# Patient Record
Sex: Female | Born: 1935 | Race: Black or African American | Hispanic: No | Smoking: Never smoker
Health system: Southern US, Community
[De-identification: ages and names within clinical notes are randomized; demographics above are authoritative.]

## PROBLEM LIST (undated history)

## (undated) DIAGNOSIS — G309 Alzheimer's disease, unspecified: Principal | ICD-10-CM

## (undated) DIAGNOSIS — H409 Unspecified glaucoma: Secondary | ICD-10-CM

## (undated) DIAGNOSIS — I10 Essential (primary) hypertension: Secondary | ICD-10-CM

## (undated) DIAGNOSIS — K5900 Constipation, unspecified: Secondary | ICD-10-CM

## (undated) DIAGNOSIS — G4700 Insomnia, unspecified: Secondary | ICD-10-CM

## (undated) DIAGNOSIS — J449 Chronic obstructive pulmonary disease, unspecified: Secondary | ICD-10-CM

## (undated) DIAGNOSIS — I739 Peripheral vascular disease, unspecified: Secondary | ICD-10-CM

## (undated) DIAGNOSIS — R4701 Aphasia: Secondary | ICD-10-CM

## (undated) DIAGNOSIS — F329 Major depressive disorder, single episode, unspecified: Secondary | ICD-10-CM

## (undated) DIAGNOSIS — R32 Unspecified urinary incontinence: Secondary | ICD-10-CM

## (undated) DIAGNOSIS — F0280 Dementia in other diseases classified elsewhere without behavioral disturbance: Secondary | ICD-10-CM

## (undated) DIAGNOSIS — R159 Full incontinence of feces: Secondary | ICD-10-CM

## (undated) HISTORY — DX: Unspecified urinary incontinence: R32

## (undated) HISTORY — DX: Constipation, unspecified: K59.00

## (undated) HISTORY — DX: Dementia in other diseases classified elsewhere, unspecified severity, without behavioral disturbance, psychotic disturbance, mood disturbance, and anxiety: F02.80

## (undated) HISTORY — DX: Insomnia, unspecified: G47.00

## (undated) HISTORY — DX: Chronic obstructive pulmonary disease, unspecified: J44.9

## (undated) HISTORY — DX: Aphasia: R47.01

## (undated) HISTORY — DX: Unspecified glaucoma: H40.9

## (undated) HISTORY — DX: Peripheral vascular disease, unspecified: I73.9

## (undated) HISTORY — DX: Major depressive disorder, single episode, unspecified: F32.9

## (undated) HISTORY — DX: Full incontinence of feces: R15.9

## (undated) HISTORY — DX: Alzheimer's disease, unspecified: G30.9

---

## 2008-05-30 HISTORY — PX: TOTAL HIP ARTHROPLASTY: SHX124

## 2008-09-19 ENCOUNTER — Encounter: Admission: RE | Admit: 2008-09-19 | Discharge: 2008-09-19 | Payer: Self-pay | Admitting: Obstetrics and Gynecology

## 2008-11-26 ENCOUNTER — Emergency Department (HOSPITAL_BASED_OUTPATIENT_CLINIC_OR_DEPARTMENT_OTHER): Admission: EM | Admit: 2008-11-26 | Discharge: 2008-11-26 | Payer: Self-pay | Admitting: Emergency Medicine

## 2008-11-26 ENCOUNTER — Ambulatory Visit: Payer: Self-pay | Admitting: Diagnostic Radiology

## 2009-07-26 ENCOUNTER — Observation Stay (HOSPITAL_COMMUNITY): Admission: EM | Admit: 2009-07-26 | Discharge: 2009-07-27 | Payer: Self-pay | Admitting: Emergency Medicine

## 2009-11-07 ENCOUNTER — Emergency Department (HOSPITAL_BASED_OUTPATIENT_CLINIC_OR_DEPARTMENT_OTHER): Admission: EM | Admit: 2009-11-07 | Discharge: 2009-11-07 | Payer: Self-pay | Admitting: Emergency Medicine

## 2009-11-07 ENCOUNTER — Ambulatory Visit: Payer: Self-pay | Admitting: Radiology

## 2010-02-03 ENCOUNTER — Emergency Department (HOSPITAL_BASED_OUTPATIENT_CLINIC_OR_DEPARTMENT_OTHER): Admission: EM | Admit: 2010-02-03 | Discharge: 2010-02-03 | Payer: Self-pay | Admitting: Emergency Medicine

## 2010-02-03 ENCOUNTER — Ambulatory Visit: Payer: Self-pay | Admitting: Radiology

## 2010-08-12 LAB — DIFFERENTIAL
Eosinophils Absolute: 0 10*3/uL (ref 0.0–0.7)
Lymphs Abs: 0.4 10*3/uL — ABNORMAL LOW (ref 0.7–4.0)
Monocytes Absolute: 0.5 10*3/uL (ref 0.1–1.0)
Neutro Abs: 4.8 10*3/uL (ref 1.7–7.7)
Neutrophils Relative %: 82 % — ABNORMAL HIGH (ref 43–77)

## 2010-08-12 LAB — POCT TOXICOLOGY PANEL

## 2010-08-12 LAB — BASIC METABOLIC PANEL
Chloride: 109 mEq/L (ref 96–112)
Glucose, Bld: 145 mg/dL — ABNORMAL HIGH (ref 70–99)
Sodium: 144 mEq/L (ref 135–145)

## 2010-08-12 LAB — URINALYSIS, ROUTINE W REFLEX MICROSCOPIC
Glucose, UA: NEGATIVE mg/dL
Hgb urine dipstick: NEGATIVE
Specific Gravity, Urine: 1.016 (ref 1.005–1.030)
Urobilinogen, UA: 0.2 mg/dL (ref 0.0–1.0)

## 2010-08-12 LAB — URINE CULTURE: Culture: NO GROWTH

## 2010-08-12 LAB — CBC
HCT: 32.4 % — ABNORMAL LOW (ref 36.0–46.0)
MCHC: 34.1 g/dL (ref 30.0–36.0)
MCV: 90.1 fL (ref 78.0–100.0)
Platelets: 295 10*3/uL (ref 150–400)
RDW: 13.1 % (ref 11.5–15.5)

## 2010-08-16 LAB — COMPREHENSIVE METABOLIC PANEL
ALT: 23 U/L (ref 0–35)
AST: 31 U/L (ref 0–37)
BUN: 20 mg/dL (ref 6–23)
CO2: 34 mEq/L — ABNORMAL HIGH (ref 19–32)
Calcium: 9.7 mg/dL (ref 8.4–10.5)
Creatinine, Ser: 1.1 mg/dL (ref 0.4–1.2)
Potassium: 4.2 mEq/L (ref 3.5–5.1)
Total Protein: 7.3 g/dL (ref 6.0–8.3)

## 2010-08-16 LAB — URINALYSIS, ROUTINE W REFLEX MICROSCOPIC
Bilirubin Urine: NEGATIVE
Hgb urine dipstick: NEGATIVE
Specific Gravity, Urine: 1.018 (ref 1.005–1.030)
Urobilinogen, UA: 0.2 mg/dL (ref 0.0–1.0)
pH: 5.5 (ref 5.0–8.0)

## 2010-08-16 LAB — DIFFERENTIAL
Basophils Absolute: 0.1 10*3/uL (ref 0.0–0.1)
Eosinophils Relative: 1 % (ref 0–5)
Monocytes Absolute: 0.3 10*3/uL (ref 0.1–1.0)
Neutro Abs: 2.3 10*3/uL (ref 1.7–7.7)
Neutrophils Relative %: 60 % (ref 43–77)

## 2010-08-16 LAB — CBC
Hemoglobin: 12.1 g/dL (ref 12.0–15.0)
MCHC: 34.3 g/dL (ref 30.0–36.0)
MCV: 89.1 fL (ref 78.0–100.0)
Platelets: 341 10*3/uL (ref 150–400)
RBC: 3.98 MIL/uL (ref 3.87–5.11)

## 2010-08-19 LAB — CBC
HCT: 36 % (ref 36.0–46.0)
Hemoglobin: 12.3 g/dL (ref 12.0–15.0)
MCHC: 34.1 g/dL (ref 30.0–36.0)
MCV: 89.1 fL (ref 78.0–100.0)
Platelets: 366 10*3/uL (ref 150–400)

## 2010-08-19 LAB — DIFFERENTIAL
Basophils Absolute: 0 10*3/uL (ref 0.0–0.1)
Basophils Relative: 0 % (ref 0–1)
Eosinophils Absolute: 0.1 10*3/uL (ref 0.0–0.7)
Lymphs Abs: 1.8 10*3/uL (ref 0.7–4.0)
Neutrophils Relative %: 68 % (ref 43–77)

## 2010-08-19 LAB — CARDIAC PANEL(CRET KIN+CKTOT+MB+TROPI)
CK, MB: 1.5 ng/mL (ref 0.3–4.0)
Relative Index: INVALID (ref 0.0–2.5)
Total CK: 52 U/L (ref 7–177)
Troponin I: 0.02 ng/mL (ref 0.00–0.06)

## 2010-08-19 LAB — POCT CARDIAC MARKERS: Myoglobin, poc: 70.5 ng/mL (ref 12–200)

## 2010-08-19 LAB — BASIC METABOLIC PANEL
CO2: 31 mEq/L (ref 19–32)
Glucose, Bld: 179 mg/dL — ABNORMAL HIGH (ref 70–99)
Potassium: 3.6 mEq/L (ref 3.5–5.1)
Sodium: 142 mEq/L (ref 135–145)

## 2010-08-19 LAB — TSH: TSH: 1.905 u[IU]/mL (ref 0.350–4.500)

## 2010-09-06 LAB — DIFFERENTIAL
Eosinophils Absolute: 0.1 10*3/uL (ref 0.0–0.7)
Eosinophils Relative: 4 % (ref 0–5)
Lymphs Abs: 1 10*3/uL (ref 0.7–4.0)
Monocytes Absolute: 0.3 10*3/uL (ref 0.1–1.0)

## 2010-09-06 LAB — CBC
HCT: 36.2 % (ref 36.0–46.0)
Hemoglobin: 12.3 g/dL (ref 12.0–15.0)
MCV: 87.2 fL (ref 78.0–100.0)
Platelets: 309 10*3/uL (ref 150–400)
RDW: 13.3 % (ref 11.5–15.5)

## 2010-09-06 LAB — BASIC METABOLIC PANEL
BUN: 18 mg/dL (ref 6–23)
Chloride: 102 mEq/L (ref 96–112)
Glucose, Bld: 95 mg/dL (ref 70–99)
Potassium: 4.3 mEq/L (ref 3.5–5.1)
Sodium: 142 mEq/L (ref 135–145)

## 2010-09-06 LAB — URINE CULTURE: Colony Count: NO GROWTH

## 2010-09-06 LAB — GLUCOSE, CAPILLARY: Glucose-Capillary: 101 mg/dL — ABNORMAL HIGH (ref 70–99)

## 2010-09-06 LAB — URINALYSIS, ROUTINE W REFLEX MICROSCOPIC
Bilirubin Urine: NEGATIVE
Ketones, ur: NEGATIVE mg/dL
Nitrite: NEGATIVE
pH: 6.5 (ref 5.0–8.0)

## 2010-09-06 LAB — URINE MICROSCOPIC-ADD ON

## 2010-09-06 LAB — POCT CARDIAC MARKERS: Troponin i, poc: <0.05 ng/mL (ref 0.00–0.09)

## 2010-11-04 ENCOUNTER — Emergency Department (HOSPITAL_COMMUNITY): Payer: Medicare Other

## 2010-11-04 ENCOUNTER — Emergency Department (HOSPITAL_COMMUNITY)
Admission: EM | Admit: 2010-11-04 | Discharge: 2010-11-04 | Disposition: A | Discharge status: Skilled Nursing Facility | Payer: Medicare Other | Attending: Emergency Medicine | Admitting: Emergency Medicine

## 2010-11-04 DIAGNOSIS — M25559 Pain in unspecified hip: Secondary | ICD-10-CM | POA: Insufficient documentation

## 2010-11-04 DIAGNOSIS — Z9181 History of falling: Secondary | ICD-10-CM | POA: Insufficient documentation

## 2010-11-04 DIAGNOSIS — M1990 Unspecified osteoarthritis, unspecified site: Secondary | ICD-10-CM | POA: Insufficient documentation

## 2010-11-04 DIAGNOSIS — F0280 Dementia in other diseases classified elsewhere without behavioral disturbance: Secondary | ICD-10-CM | POA: Insufficient documentation

## 2010-11-04 DIAGNOSIS — Y9210 Unspecified residential institution as the place of occurrence of the external cause: Secondary | ICD-10-CM | POA: Insufficient documentation

## 2010-11-04 DIAGNOSIS — Z0000 Encounter for general adult medical examination without abnormal findings: Secondary | ICD-10-CM | POA: Insufficient documentation

## 2010-11-04 DIAGNOSIS — I10 Essential (primary) hypertension: Secondary | ICD-10-CM | POA: Insufficient documentation

## 2010-11-04 DIAGNOSIS — G309 Alzheimer's disease, unspecified: Secondary | ICD-10-CM | POA: Insufficient documentation

## 2010-11-04 DIAGNOSIS — W19XXXA Unspecified fall, initial encounter: Secondary | ICD-10-CM | POA: Insufficient documentation

## 2011-01-03 ENCOUNTER — Emergency Department (HOSPITAL_COMMUNITY): Payer: Medicare Other

## 2011-01-03 ENCOUNTER — Emergency Department (HOSPITAL_COMMUNITY)
Admission: EM | Admit: 2011-01-03 | Discharge: 2011-01-03 | Disposition: A | Discharge status: Home / Self Care | Payer: Medicare Other | Attending: Emergency Medicine | Admitting: Emergency Medicine

## 2011-01-03 DIAGNOSIS — F068 Other specified mental disorders due to known physiological condition: Secondary | ICD-10-CM | POA: Insufficient documentation

## 2011-01-03 DIAGNOSIS — I739 Peripheral vascular disease, unspecified: Secondary | ICD-10-CM | POA: Insufficient documentation

## 2011-01-03 DIAGNOSIS — R4182 Altered mental status, unspecified: Secondary | ICD-10-CM | POA: Insufficient documentation

## 2011-01-03 DIAGNOSIS — F0280 Dementia in other diseases classified elsewhere without behavioral disturbance: Secondary | ICD-10-CM | POA: Insufficient documentation

## 2011-01-03 DIAGNOSIS — G309 Alzheimer's disease, unspecified: Secondary | ICD-10-CM | POA: Insufficient documentation

## 2011-01-03 DIAGNOSIS — Z79899 Other long term (current) drug therapy: Secondary | ICD-10-CM | POA: Insufficient documentation

## 2011-01-03 DIAGNOSIS — I10 Essential (primary) hypertension: Secondary | ICD-10-CM | POA: Insufficient documentation

## 2011-01-03 LAB — BASIC METABOLIC PANEL
BUN: 26 mg/dL — ABNORMAL HIGH (ref 6–23)
CO2: 31 mEq/L (ref 19–32)
Chloride: 102 mEq/L (ref 96–112)
Creatinine, Ser: 1.01 mg/dL (ref 0.50–1.10)
GFR calc Af Amer: >60 mL/min (ref >60)
Potassium: 4.9 mEq/L (ref 3.5–5.1)

## 2011-01-03 LAB — CBC
HCT: 33.5 % — ABNORMAL LOW (ref 36.0–46.0)
Hemoglobin: 10.9 g/dL — ABNORMAL LOW (ref 12.0–15.0)
MCH: 26.9 pg (ref 26.0–34.0)
MCV: 82.7 fL (ref 78.0–100.0)
Platelets: 370 10*3/uL (ref 150–400)
RBC: 4.05 MIL/uL (ref 3.87–5.11)
WBC: 5.5 10*3/uL (ref 4.0–10.5)

## 2011-01-03 LAB — URINALYSIS, ROUTINE W REFLEX MICROSCOPIC
Bilirubin Urine: NEGATIVE
Glucose, UA: NEGATIVE mg/dL
Ketones, ur: NEGATIVE mg/dL
Nitrite: NEGATIVE
Specific Gravity, Urine: 1.018 (ref 1.005–1.030)
pH: 5 (ref 5.0–8.0)

## 2011-01-03 LAB — DIFFERENTIAL
Lymphocytes Relative: 24 % (ref 12–46)
Lymphs Abs: 1.3 10*3/uL (ref 0.7–4.0)
Monocytes Relative: 8 % (ref 3–12)
Neutro Abs: 3.7 10*3/uL (ref 1.7–7.7)
Neutrophils Relative %: 66 % (ref 43–77)

## 2011-01-04 LAB — URINE CULTURE
Colony Count: NO GROWTH
Culture  Setup Time: 201208061254
Culture: NO GROWTH

## 2011-01-26 ENCOUNTER — Emergency Department (HOSPITAL_COMMUNITY)
Admission: EM | Admit: 2011-01-26 | Discharge: 2011-01-26 | Disposition: A | Discharge status: Home / Self Care | Payer: Medicare Other | Attending: Emergency Medicine | Admitting: Emergency Medicine

## 2011-01-26 DIAGNOSIS — F3289 Other specified depressive episodes: Secondary | ICD-10-CM | POA: Insufficient documentation

## 2011-01-26 DIAGNOSIS — F329 Major depressive disorder, single episode, unspecified: Secondary | ICD-10-CM | POA: Insufficient documentation

## 2011-01-26 DIAGNOSIS — R4182 Altered mental status, unspecified: Secondary | ICD-10-CM | POA: Insufficient documentation

## 2011-01-26 DIAGNOSIS — F29 Unspecified psychosis not due to a substance or known physiological condition: Secondary | ICD-10-CM | POA: Insufficient documentation

## 2011-01-26 DIAGNOSIS — G309 Alzheimer's disease, unspecified: Secondary | ICD-10-CM | POA: Insufficient documentation

## 2011-01-26 DIAGNOSIS — I10 Essential (primary) hypertension: Secondary | ICD-10-CM | POA: Insufficient documentation

## 2011-01-26 DIAGNOSIS — I739 Peripheral vascular disease, unspecified: Secondary | ICD-10-CM | POA: Insufficient documentation

## 2011-01-26 DIAGNOSIS — F0280 Dementia in other diseases classified elsewhere without behavioral disturbance: Secondary | ICD-10-CM | POA: Insufficient documentation

## 2011-01-26 DIAGNOSIS — Z79899 Other long term (current) drug therapy: Secondary | ICD-10-CM | POA: Insufficient documentation

## 2011-01-26 LAB — DIFFERENTIAL
Basophils Absolute: 0 10*3/uL (ref 0.0–0.1)
Basophils Relative: 0 % (ref 0–1)
Eosinophils Absolute: 0.1 10*3/uL (ref 0.0–0.7)
Monocytes Absolute: 0.3 10*3/uL (ref 0.1–1.0)
Monocytes Relative: 6 % (ref 3–12)
Neutrophils Relative %: 66 % (ref 43–77)

## 2011-01-26 LAB — COMPREHENSIVE METABOLIC PANEL
ALT: 14 U/L (ref 0–35)
AST: 20 U/L (ref 0–37)
Albumin: 3.4 g/dL — ABNORMAL LOW (ref 3.5–5.2)
Calcium: 9.6 mg/dL (ref 8.4–10.5)
GFR calc Af Amer: 59 mL/min — ABNORMAL LOW (ref >60)
Potassium: 4 mEq/L (ref 3.5–5.1)
Sodium: 140 mEq/L (ref 135–145)
Total Protein: 7 g/dL (ref 6.0–8.3)

## 2011-01-26 LAB — URINALYSIS, ROUTINE W REFLEX MICROSCOPIC
Bilirubin Urine: NEGATIVE
Ketones, ur: NEGATIVE mg/dL
Nitrite: NEGATIVE
Protein, ur: NEGATIVE mg/dL
Urobilinogen, UA: 0.2 mg/dL (ref 0.0–1.0)

## 2011-01-26 LAB — CBC
MCH: 26.9 pg (ref 26.0–34.0)
MCHC: 32.3 g/dL (ref 30.0–36.0)
Platelets: 370 10*3/uL (ref 150–400)
RDW: 17.3 % — ABNORMAL HIGH (ref 11.5–15.5)

## 2011-07-04 IMAGING — CR DG CHEST 2V
2 series · 2 of 2 positions shown · non-contrast
Comparison: 07/26/2009.

CLINICAL DATA: Altered mental status.  COPD.

CHEST - 2 VIEW

[w chest pa]
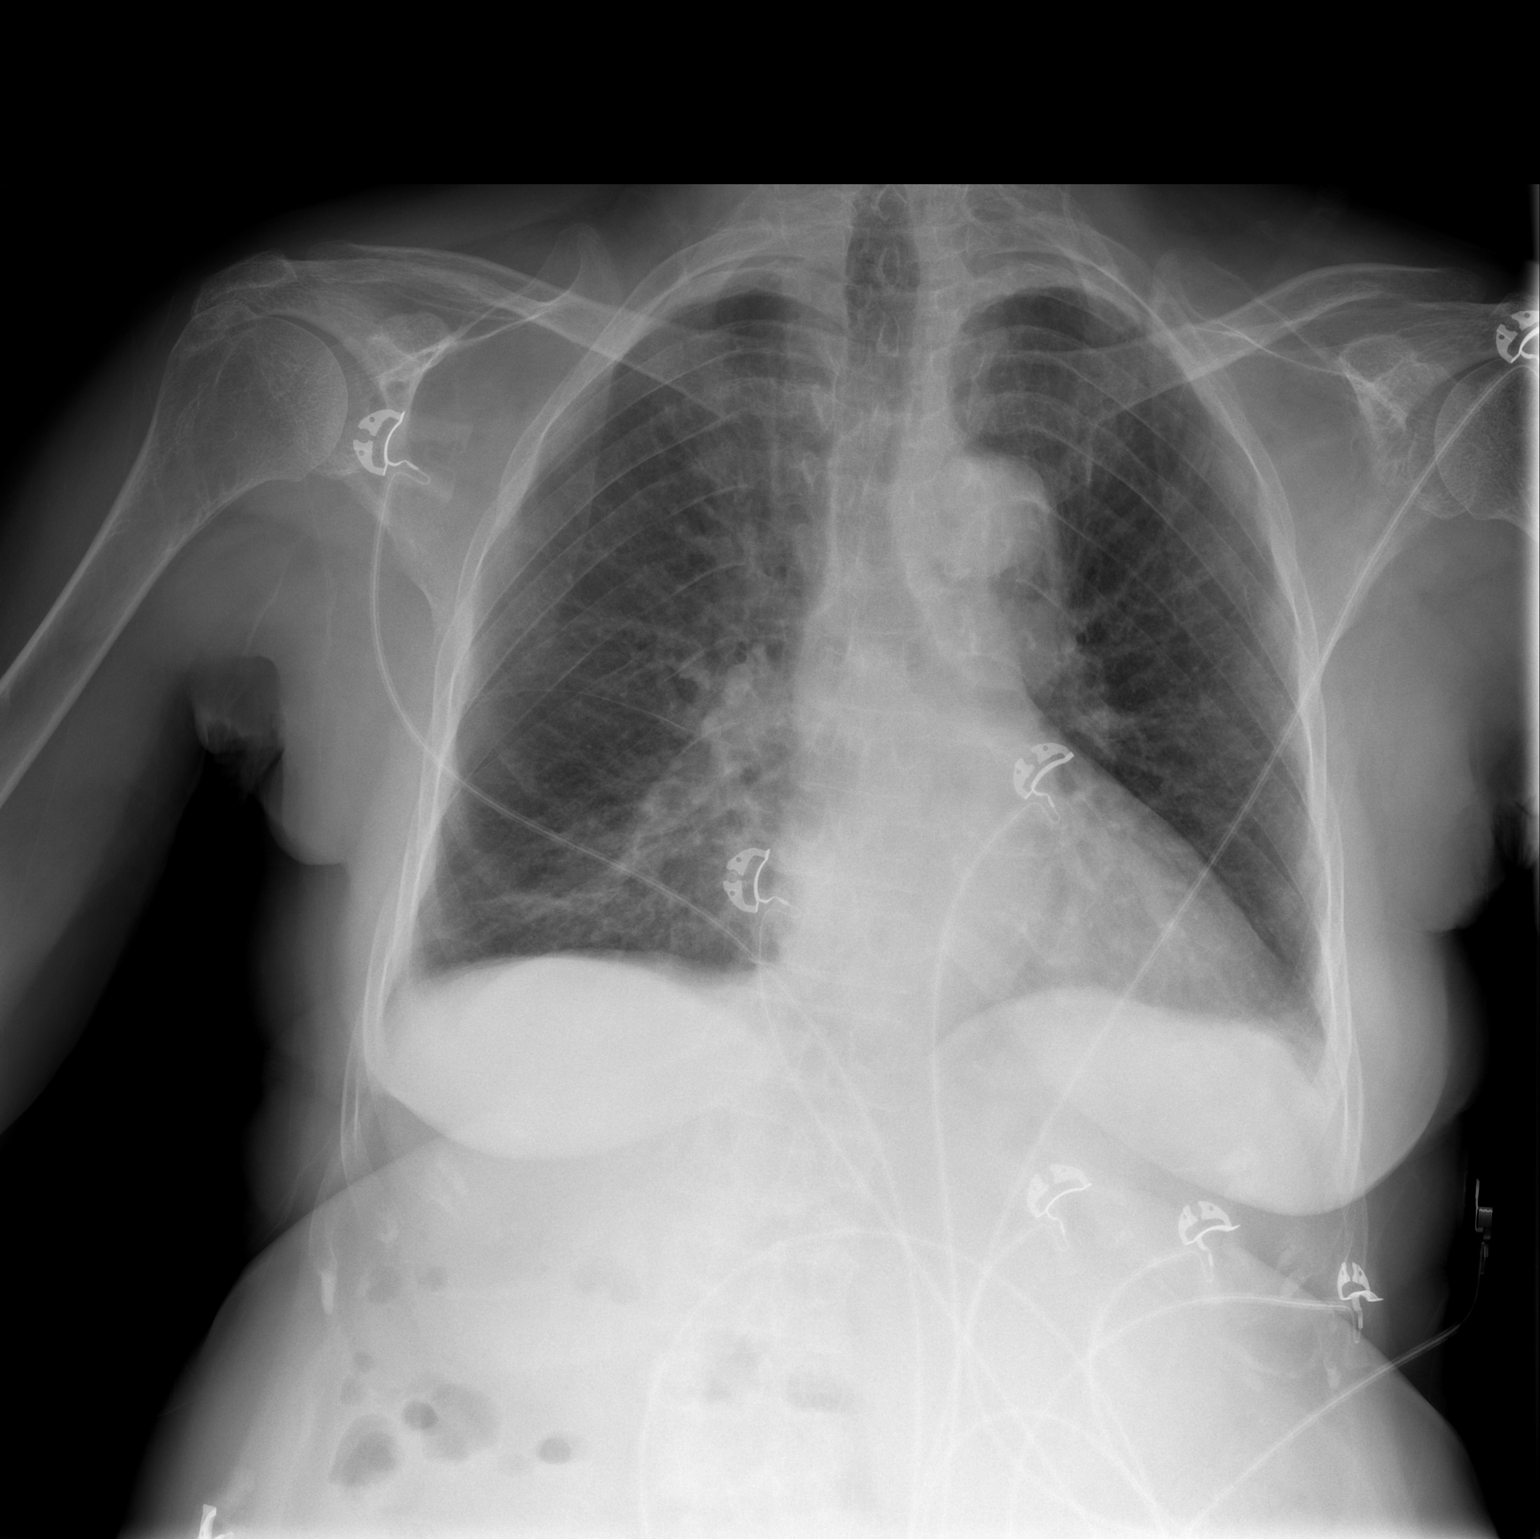

[w chest lat]
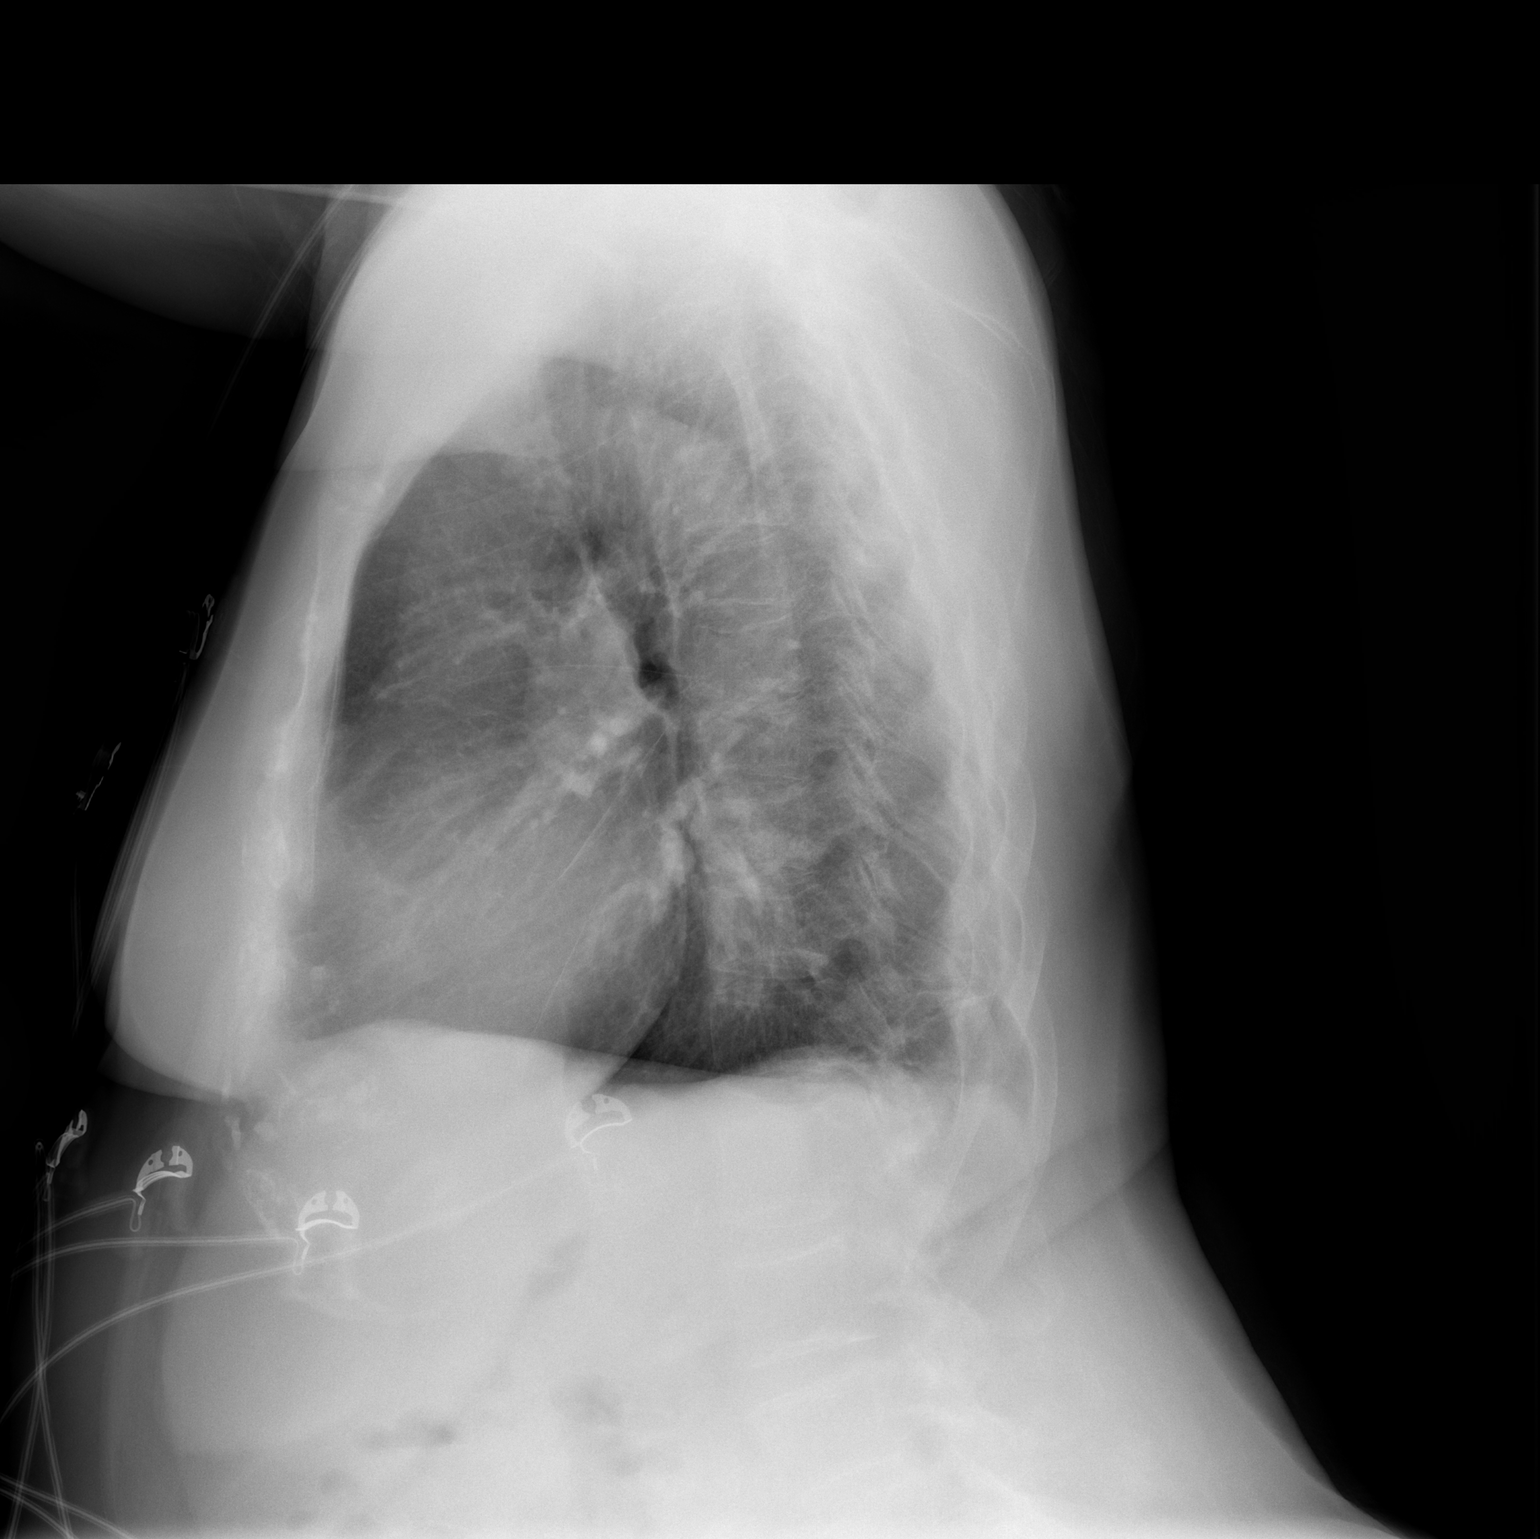

[2 of 2 positions shown; findings below may reference images not displayed]

FINDINGS: The cardiopericardial silhouette is mildly enlarged.
Linear density at the right lung base is compatible with
subsegmental atelectasis.  There is slight blunting of the
costophrenic angles bilaterally, similar to the prior exam.  This
may represent chronic scarring.  Changes of COPD are again noted.
No other focal airspace disease is evident.  The visualized soft
tissues and bony thorax are unremarkable.
IMPRESSION: 1.  Minimal subsegmental atelectasis at the right lung base.
2.  Changes compatible with COPD.
3.  Chronic scarring versus small bilateral pleural effusions.

## 2011-09-30 IMAGING — CR DG ABDOMEN 1V
1 series · 1 of 1 positions shown · non-contrast
Comparison: None.

CLINICAL DATA: Altered mental status

ABDOMEN - 1 VIEW

[t abdomen supine]
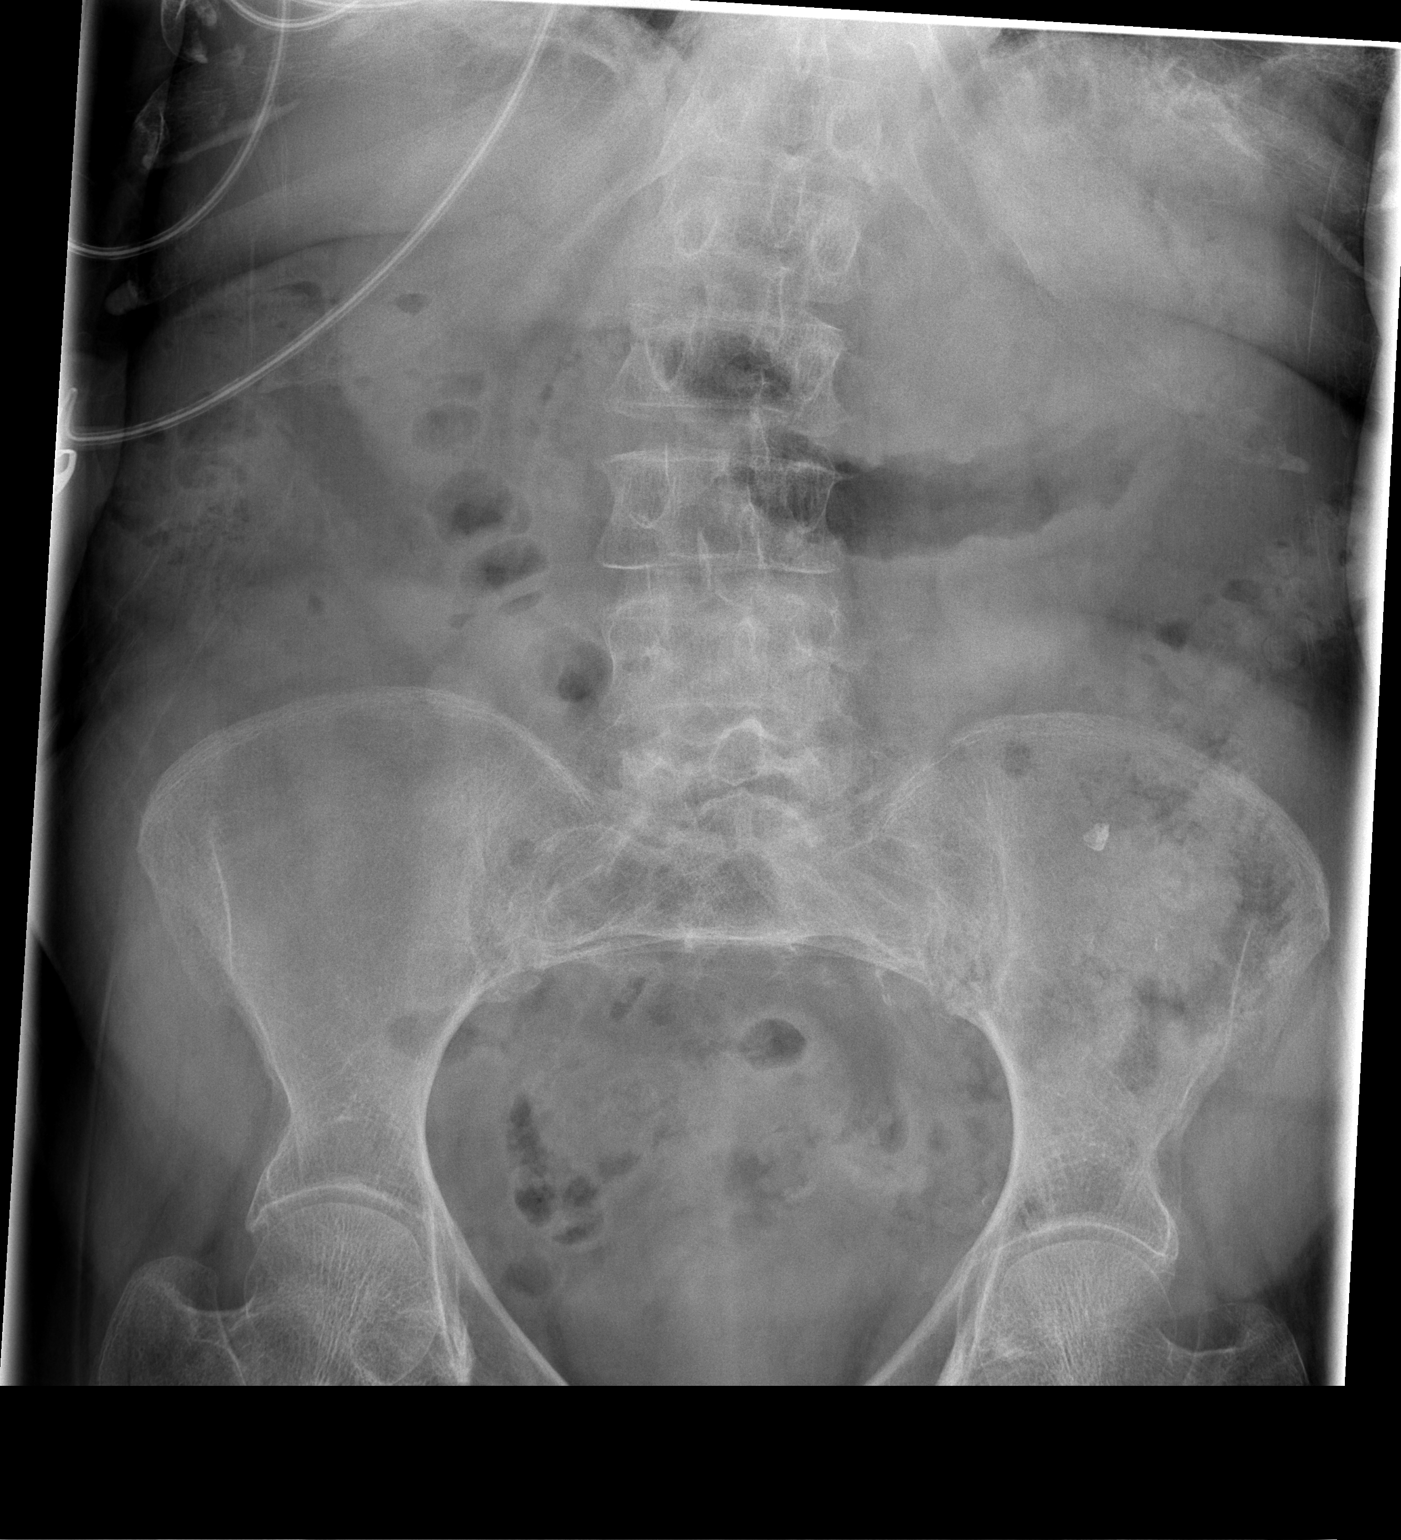

[1 of 1 positions shown; findings below may reference images not displayed]

FINDINGS: There is no evidence of small bowel obstruction.
Specifically, there are no dilated loops small bowel .  Gas and
stool seen throughout the colon.  The calcifications seen in the
left mid abdomen likely related to diverticulum.  The osseous
structures are within normal limits.
IMPRESSION: No acute findings.

## 2013-01-25 ENCOUNTER — Emergency Department (HOSPITAL_COMMUNITY): Payer: Medicare Other

## 2013-01-25 ENCOUNTER — Inpatient Hospital Stay (HOSPITAL_COMMUNITY)
Admission: EM | Admit: 2013-01-25 | Discharge: 2013-01-27 | DRG: 682 | Disposition: A | Discharge status: Nursing Home (Includes ICF) | Payer: Medicare Other | Attending: Internal Medicine | Admitting: Internal Medicine

## 2013-01-25 DIAGNOSIS — N179 Acute kidney failure, unspecified: Principal | ICD-10-CM | POA: Diagnosis present

## 2013-01-25 DIAGNOSIS — E860 Dehydration: Secondary | ICD-10-CM | POA: Diagnosis present

## 2013-01-25 DIAGNOSIS — T50995A Adverse effect of other drugs, medicaments and biological substances, initial encounter: Secondary | ICD-10-CM | POA: Diagnosis present

## 2013-01-25 DIAGNOSIS — T4275XA Adverse effect of unspecified antiepileptic and sedative-hypnotic drugs, initial encounter: Secondary | ICD-10-CM | POA: Diagnosis present

## 2013-01-25 DIAGNOSIS — N390 Urinary tract infection, site not specified: Secondary | ICD-10-CM | POA: Diagnosis present

## 2013-01-25 DIAGNOSIS — E870 Hyperosmolality and hypernatremia: Secondary | ICD-10-CM | POA: Diagnosis present

## 2013-01-25 DIAGNOSIS — G9340 Encephalopathy, unspecified: Secondary | ICD-10-CM | POA: Diagnosis present

## 2013-01-25 DIAGNOSIS — R892 Abnormal level of other drugs, medicaments and biological substances in specimens from other organs, systems and tissues: Secondary | ICD-10-CM | POA: Diagnosis present

## 2013-01-25 DIAGNOSIS — Z79899 Other long term (current) drug therapy: Secondary | ICD-10-CM

## 2013-01-25 LAB — COMPREHENSIVE METABOLIC PANEL
AST: 17 U/L (ref 0–37)
Albumin: 3.1 g/dL — ABNORMAL LOW (ref 3.5–5.2)
Calcium: 9.6 mg/dL (ref 8.4–10.5)
Chloride: 107 mEq/L (ref 96–112)
Creatinine, Ser: 1.29 mg/dL — ABNORMAL HIGH (ref 0.50–1.10)

## 2013-01-25 LAB — URINE MICROSCOPIC-ADD ON

## 2013-01-25 LAB — CBC WITH DIFFERENTIAL/PLATELET
Basophils Absolute: 0 10*3/uL (ref 0.0–0.1)
Basophils Relative: 0 % (ref 0–1)
Eosinophils Relative: 3 % (ref 0–5)
HCT: 36.9 % (ref 36.0–46.0)
MCHC: 31.2 g/dL (ref 30.0–36.0)
Monocytes Absolute: 0.4 10*3/uL (ref 0.1–1.0)
Neutro Abs: 2.8 10*3/uL (ref 1.7–7.7)
RDW: 14.2 % (ref 11.5–15.5)

## 2013-01-25 LAB — URINALYSIS, ROUTINE W REFLEX MICROSCOPIC
Bilirubin Urine: NEGATIVE
Glucose, UA: NEGATIVE mg/dL
Hgb urine dipstick: NEGATIVE
Nitrite: NEGATIVE
Specific Gravity, Urine: 1.018 (ref 1.005–1.030)
pH: 5 (ref 5.0–8.0)

## 2013-01-25 MED ORDER — TAB-A-VITE/IRON PO TABS
1.0000 | ORAL_TABLET | Freq: Every day | ORAL | Status: DC
  Administered 2013-01-26 – 2013-01-27 (×2): 1 via ORAL
  Filled 2013-01-25 (×2): qty 1

## 2013-01-25 MED ORDER — METOPROLOL SUCCINATE 12.5 MG HALF TABLET
12.5000 mg | ORAL_TABLET | Freq: Two times a day (BID) | ORAL | Status: DC
  Administered 2013-01-26 – 2013-01-27 (×3): 12.5 mg via ORAL
  Filled 2013-01-25 (×6): qty 1

## 2013-01-25 MED ORDER — DIVALPROEX SODIUM ER 250 MG PO TB24
250.0000 mg | ORAL_TABLET | Freq: Every day | ORAL | Status: DC
  Administered 2013-01-26: 250 mg via ORAL
  Filled 2013-01-25 (×2): qty 1

## 2013-01-25 MED ORDER — DIVALPROEX SODIUM 250 MG PO DR TAB: 250.0000 mg | DELAYED_RELEASE_TABLET | Freq: Once | ORAL | Status: DC

## 2013-01-25 MED ORDER — SODIUM CHLORIDE 0.9 % IV SOLN
Freq: Once | INTRAVENOUS | Status: AC
  Administered 2013-01-25: 18:00:00 via INTRAVENOUS

## 2013-01-25 MED ORDER — GUAIFENESIN ER 600 MG PO TB12
1200.0000 mg | ORAL_TABLET | Freq: Two times a day (BID) | ORAL | Status: DC | PRN
  Filled 2013-01-25: qty 2

## 2013-01-25 MED ORDER — RISPERIDONE 2 MG PO TABS
2.0000 mg | ORAL_TABLET | Freq: Every day | ORAL | Status: DC
  Administered 2013-01-26: 2 mg via ORAL
  Filled 2013-01-25 (×3): qty 1

## 2013-01-25 MED ORDER — ASPIRIN 81 MG PO CHEW
81.0000 mg | CHEWABLE_TABLET | Freq: Every day | ORAL | Status: DC
  Administered 2013-01-26 – 2013-01-27 (×2): 81 mg via ORAL
  Filled 2013-01-25 (×2): qty 1

## 2013-01-25 MED ORDER — FERROUS SULFATE 325 (65 FE) MG PO TABS
325.0000 mg | ORAL_TABLET | Freq: Two times a day (BID) | ORAL | Status: DC
  Administered 2013-01-26 – 2013-01-27 (×3): 325 mg via ORAL
  Filled 2013-01-25 (×4): qty 1

## 2013-01-25 MED ORDER — ACETAMINOPHEN 500 MG PO TABS: 500.0000 mg | ORAL_TABLET | Freq: Four times a day (QID) | ORAL | Status: DC | PRN

## 2013-01-25 MED ORDER — SODIUM CHLORIDE 0.9 % IV SOLN
INTRAVENOUS | Status: DC
  Administered 2013-01-25: 1000 mL via INTRAVENOUS

## 2013-01-25 MED ORDER — SIMVASTATIN 5 MG PO TABS
5.0000 mg | ORAL_TABLET | Freq: Every day | ORAL | Status: DC
  Administered 2013-01-26: 5 mg via ORAL
  Filled 2013-01-25 (×3): qty 1

## 2013-01-25 MED ORDER — ONDANSETRON HCL 4 MG/2ML IJ SOLN: 4.0000 mg | Freq: Four times a day (QID) | INTRAMUSCULAR | Status: DC | PRN

## 2013-01-25 MED ORDER — CEFTRIAXONE SODIUM 1 G IJ SOLR
1.0000 g | Freq: Once | INTRAMUSCULAR | Status: DC
  Administered 2013-01-25: 1 g via INTRAVENOUS
  Filled 2013-01-25: qty 10

## 2013-01-25 MED ORDER — LATANOPROST 0.005 % OP SOLN
1.0000 [drp] | Freq: Every day | OPHTHALMIC | Status: DC
  Administered 2013-01-25 – 2013-01-26 (×2): 1 [drp] via OPHTHALMIC
  Filled 2013-01-25: qty 2.5

## 2013-01-25 MED ORDER — DEXTROSE 5 % IV SOLN
1.0000 g | INTRAVENOUS | Status: DC
  Administered 2013-01-26: 1 g via INTRAVENOUS
  Filled 2013-01-25 (×2): qty 10

## 2013-01-25 MED ORDER — ENOXAPARIN SODIUM 30 MG/0.3ML ~~LOC~~ SOLN
30.0000 mg | SUBCUTANEOUS | Status: DC
  Administered 2013-01-25 – 2013-01-26 (×2): 30 mg via SUBCUTANEOUS
  Filled 2013-01-25 (×3): qty 0.3

## 2013-01-25 MED ORDER — ONDANSETRON HCL 4 MG PO TABS: 4.0000 mg | ORAL_TABLET | Freq: Four times a day (QID) | ORAL | Status: DC | PRN

## 2013-01-25 MED ORDER — SENNOSIDES-DOCUSATE SODIUM 8.6-50 MG PO TABS
1.0000 | ORAL_TABLET | Freq: Every evening | ORAL | Status: DC | PRN
  Filled 2013-01-25: qty 1

## 2013-01-25 NOTE — ED Provider Notes (Signed)
CSN: [id number]     Arrival date & time 01/25/13  1322 History   First MD Initiated Contact with Patient 01/25/13 1358     Chief Complaint  Patient presents with  . Altered Mental Status    HPI   Patient is a resident of extended care facility.  He is on psychotropic medications apparently for episodes of agitation. For these Depakote. Her Depakote was doubled on the 21st. Granddaughter who is here with her states that her energy level mental status has declined since that time. She requested she be transferred here.   Her granddaughter provide review of systems. She sees her grandmother about every other day. She to her knowledge she has not been ill. She's not had dysuria. She was eating well. No fevers no chills no vomiting No past medical history on file. No past surgical history on file. No family history on file. History  Substance Use Topics  . Smoking status: Not on file  . Smokeless tobacco: Not on file  . Alcohol Use: Not on file   OB History   No data available     Review of Systems  Constitutional: Positive for fatigue. Negative for fever, chills, diaphoresis and appetite change.  HENT: Negative for sore throat, mouth sores and trouble swallowing.   Eyes: Negative for visual disturbance.  Respiratory: Positive for cough. Negative for chest tightness, shortness of breath and wheezing.   Cardiovascular: Negative for chest pain.  Gastrointestinal: Negative for nausea, vomiting, abdominal pain, diarrhea and abdominal distention.  Endocrine: Negative for polydipsia, polyphagia and polyuria.  Genitourinary: Negative for dysuria, frequency and hematuria.  Musculoskeletal: Negative for gait problem.  Skin: Negative for color change, pallor and rash.  Neurological: Positive for weakness. Negative for dizziness, syncope, light-headedness and headaches.  Hematological: Does not bruise/bleed easily.  Psychiatric/Behavioral: Positive for behavioral problems and confusion.     Allergies  Review of patient's allergies indicates no known allergies.  Home Medications   Current Outpatient Rx  Name  Route  Sig  Dispense  Refill  . acetaminophen (TYLENOL) 500 MG tablet   Oral   Take 500 mg by mouth every 6 (six) hours as needed for pain.         Marland Kitchen aspirin 81 MG tablet   Oral   Take 81 mg by mouth daily.         . bisacodyl (DULCOLAX) 10 MG suppository   Rectal   Place 10 mg rectally as needed for constipation.         . divalproex (DEPAKOTE ER) 250 MG 24 hr tablet   Oral   Take 250 mg by mouth daily.         . ferrous sulfate 325 (65 FE) MG tablet   Oral   Take 325 mg by mouth 2 (two) times daily.         . furosemide (LASIX) 20 MG tablet   Oral   Take 10 mg by mouth daily.         Marland Kitchen guaiFENesin (MUCINEX) 600 MG 12 hr tablet   Oral   Take 1,200 mg by mouth 2 (two) times daily as needed for congestion (cough).         . latanoprost (XALATAN) 0.005 % ophthalmic solution   Both Eyes   Place 1 drop into both eyes at bedtime.         Marland Kitchen lisinopril (PRINIVIL,ZESTRIL) 5 MG tablet   Oral   Take 5 mg by mouth daily.         Marland Kitchen  magnesium hydroxide (MILK OF MAGNESIA) 400 MG/5ML suspension   Oral   Take 1 mL by mouth daily as needed for constipation.         . Melatonin 3 MG TABS   Oral   Take 3 mg by mouth at bedtime.         . metoprolol succinate (TOPROL-XL) 25 MG 24 hr tablet   Oral   Take 12.5 mg by mouth 2 (two) times daily.         . Multiple Vitamins-Iron (MULTIVITAMINS WITH IRON) TABS tablet   Oral   Take 1 tablet by mouth daily.         . pravastatin (PRAVACHOL) 10 MG tablet   Oral   Take 10 mg by mouth daily.         . risperiDONE (RISPERDAL) 2 MG tablet   Oral   Take 2 mg by mouth at bedtime.         . Skin Protectants, Misc. (EUCERIN) cream   Topical   Apply 1 application topically daily.         . sodium chloride ([employer] SODIUM CHLORIDE) 5 % ophthalmic ointment   Right Eye   Place 1 drop  into the right eye at bedtime.          BP 111/46  Pulse 88  Resp 33  SpO2 97% Physical Exam  Constitutional: She is oriented to person, place, and time. She appears well-developed and well-nourished. No distress.  Thin elderly female.  She answers simple questions usually with "UH_HUH"  Or "UH-UH".  Her interactions and responses to seem appropriate  HENT:  Head: Normocephalic.  Mouth/Throat:    Eyes: Conjunctivae are normal. Pupils are equal, round, and reactive to light. No scleral icterus.  Neck: Normal range of motion. Neck supple. No thyromegaly present.  Cardiovascular: Normal rate and regular rhythm.  Exam reveals no gallop and no friction rub.   No murmur heard. Is not tachycardic  Pulmonary/Chest: Effort normal and breath sounds normal. No respiratory distress. She has no wheezes. She has no rales.  Lungs are clear and no increased work or breathing  Abdominal: Soft. Bowel sounds are normal. She exhibits no distension. There is no tenderness. There is no rebound.  Musculoskeletal: Normal range of motion.  Neurological: She is alert and oriented to person, place, and time.  Eyes closed. Does respond as above. She will move all 4 extremity  Skin: Skin is warm and dry. No rash noted.  Psychiatric: She has a normal mood and affect. Her behavior is normal.    ED Course  Procedures (including critical care time) Labs Review Labs Reviewed  URINALYSIS, ROUTINE W REFLEX MICROSCOPIC - Abnormal; Notable for the following:    APPearance CLOUDY (*)    Leukocytes, UA LARGE (*)    All other components within normal limits  CBC WITH DIFFERENTIAL - Abnormal; Notable for the following:    Hemoglobin 11.5 (*)    All other components within normal limits  COMPREHENSIVE METABOLIC PANEL - Abnormal; Notable for the following:    BUN 35 (*)    Creatinine, Ser 1.29 (*)    Albumin 3.1 (*)    Total Bilirubin 0.2 (*)    GFR calc non Af Amer 35 (*)    GFR calc Af Amer 41 (*)    All  other components within normal limits  URINE MICROSCOPIC-ADD ON - Abnormal; Notable for the following:    Bacteria, UA MANY (*)    All other  components within normal limits  URINE CULTURE  VALPROIC ACID LEVEL   Imaging Review Dg Chest 1 View  01/25/2013   *RADIOLOGY REPORT*  Clinical Data: Unresponsive, altered mental status  CHEST - 1 VIEW  Comparison: 01/03/2011  Findings: Cardiomegaly is noted.  Study is limited by poor inspiration.  Central mild vascular congestion without convincing pulmonary edema.  No segmental infiltrate. Mild basilar atelectasis.  IMPRESSION: Study is limited by poor inspiration.  Central mild vascular congestion without convincing pulmonary edema.  No segmental infiltrate. Mild basilar atelectasis.   Original Report Authenticated By: Natasha Mead, M.D.    MDM   1. UTI (lower urinary tract infection)    This elderly female who had a change in mental status after a drug dosage was changed we'll check a Depakote level. We'll check a CMP. We'll check a urine. She's had a cough but is not hypoxemic and has a normal pulmonary exam. However we will obtain a chest x-ray.  Chest x-ray shows no acute disease. Her urine appears obviously infected. Clinically she appears dry she has not been taking in much. I still do not have her Depakote level. Will use Allevyn is awake plays bingo is active. And chem treatments with IV antibiotics and fluids as indicated. They simply need holding of her Depakote dose and hydration at this is elevated.    Claudean Kinds, MD 01/25/13 [phone]

## 2013-01-25 NOTE — H&P (Signed)
[facility]          History and Physical    PCP:   No primary provider on file.   Chief Complaint:  Increased lethargy  HPI: Patient is a 79 year old woman who resides at skilled nursing facility, who is brought in today at the urging of her granddaughter secondary to increased confusion. Granddaughter states that about 10 days ago they doubled the dose of her Depakote and that about 3 days later she started noticing her decline. Patient is usually alert enough to where she paddles around in her wheelchair and is able to play bingo, however she has not been able to do this for the past several days. Workup in the emergency department is significant for a urinary tract infection and acute renal failure. We have been asked to admit her for further evaluation and management.  Allergies:  No Known Allergies   No past medical history on file.  No past surgical history on file.  Prior to Admission medications   Medication Sig Start Date End Date Taking? Authorizing Provider  acetaminophen (TYLENOL) 500 MG tablet Take 500 mg by mouth every 6 (six) hours as needed for pain.   Yes Historical Provider, MD  aspirin 81 MG tablet Take 81 mg by mouth daily.   Yes Historical Provider, MD  bisacodyl (DULCOLAX) 10 MG suppository Place 10 mg rectally as needed for constipation.   Yes Historical Provider, MD  divalproex (DEPAKOTE ER) 250 MG 24 hr tablet Take 250 mg by mouth daily.   Yes Historical Provider, MD  ferrous sulfate 325 (65 FE) MG tablet Take 325 mg by mouth 2 (two) times daily.   Yes Historical Provider, MD  furosemide (LASIX) 20 MG tablet Take 10 mg by mouth daily.   Yes Historical Provider, MD  guaiFENesin (MUCINEX) 600 MG 12 hr tablet Take 1,200 mg by mouth 2 (two) times daily as needed for congestion (cough).   Yes Historical Provider, MD  latanoprost (XALATAN) 0.005 % ophthalmic solution Place 1 drop into both eyes at bedtime.   Yes Historical Provider, MD  lisinopril  (PRINIVIL,ZESTRIL) 5 MG tablet Take 5 mg by mouth daily.   Yes Historical Provider, MD  magnesium hydroxide (MILK OF MAGNESIA) 400 MG/5ML suspension Take 1 mL by mouth daily as needed for constipation.   Yes Historical Provider, MD  Melatonin 3 MG TABS Take 3 mg by mouth at bedtime.   Yes Historical Provider, MD  metoprolol succinate (TOPROL-XL) 25 MG 24 hr tablet Take 12.5 mg by mouth 2 (two) times daily.   Yes Historical Provider, MD  Multiple Vitamins-Iron (MULTIVITAMINS WITH IRON) TABS tablet Take 1 tablet by mouth daily.   Yes Historical Provider, MD  pravastatin (PRAVACHOL) 10 MG tablet Take 10 mg by mouth daily.   Yes Historical Provider, MD  risperiDONE (RISPERDAL) 2 MG tablet Take 2 mg by mouth at bedtime.   Yes Historical Provider, MD  Skin Protectants, Misc. (EUCERIN) cream Apply 1 application topically daily.   Yes Historical Provider, MD  sodium chloride ([employer]S SODIUM CHLORIDE) 5 % ophthalmic ointment Place 1 drop into the right eye at bedtime.   Yes Historical Provider, MD    Social History:  has no tobacco, alcohol, and drug history on file.  No family history on file.  Review of Systems:  Constitutional: Denies fever, chills, diaphoresis, appetite change and fatigue.  HEENT: Denies photophobia, eye pain, redness, hearing loss, ear pain, congestion, sore throat, rhinorrhea, sneezing, mouth sores, trouble swallowing, neck pain, neck stiffness  and tinnitus.   Respiratory: Denies SOB, DOE, cough, chest tightness,  and wheezing.   Cardiovascular: Denies chest pain, palpitations and leg swelling.  Gastrointestinal: Denies nausea, vomiting, abdominal pain, diarrhea, constipation, blood in stool and abdominal distention.  Genitourinary: Denies dysuria, urgency, frequency, hematuria, flank pain and difficulty urinating.  Endocrine: Denies: hot or cold intolerance, sweats, changes in hair or nails, polyuria, polydipsia. Musculoskeletal: Denies myalgias, back pain, joint swelling,  arthralgias and gait problem.  Skin: Denies pallor, rash and wound.  Neurological: Denies dizziness, seizures, syncope, weakness, light-headedness, numbness and headaches.  Hematological: Denies adenopathy. Easy bruising, personal or family bleeding history  Psychiatric/Behavioral: Denies suicidal ideation, mood changes, confusion, nervousness, sleep disturbance and agitation   Physical Exam: Blood pressure 101/46, pulse 76, resp. rate 18, SpO2 97.00%. General: Alert, awake, can answer simple questions. HEENT: Normocephalic, atraumatic, pupils equal round reactive to light, dry mucous membranes with cracked lips and tongue. Neck: Supple, no JVD, no lymphadenopathy, no bruits, no goiter. Cardiovascular: Regular rate and rhythm, no murmurs, rubs or gallops. Lungs: Clear to auscultation bilaterally. Abdomen: Soft, nontender, nondistended, positive bowel sounds, no masses or organomegaly noted. Extremities: No clubbing, cyanosis or edema, positive pedal pulses. Neurologic: Nonfocal. I have not ambulated her.  Labs on Admission:  Results for orders placed during the hospital encounter of 01/25/13 (from the past 48 hour(s))  CBC WITH DIFFERENTIAL     Status: Abnormal   Collection Time    01/25/13  2:25 PM      Result Value Range   WBC 4.6  4.0 - 10.5 K/uL   RBC 3.98  3.87 - 5.11 MIL/uL   Hemoglobin 11.5 (*) 12.0 - 15.0 g/dL   HCT 16.1  09.6 - 04.5 %   MCV 92.7  78.0 - 100.0 fL   MCH 28.9  26.0 - 34.0 pg   MCHC 31.2  30.0 - 36.0 g/dL   RDW 40.9  81.1 - 91.4 %   Platelets 327  150 - 400 K/uL   Neutrophils Relative % 60  43 - 77 %   Neutro Abs 2.8  1.7 - 7.7 K/uL   Lymphocytes Relative 29  12 - 46 %   Lymphs Abs 1.4  0.7 - 4.0 K/uL   Monocytes Relative 8  3 - 12 %   Monocytes Absolute 0.4  0.1 - 1.0 K/uL   Eosinophils Relative 3  0 - 5 %   Eosinophils Absolute 0.2  0.0 - 0.7 K/uL   Basophils Relative 0  0 - 1 %   Basophils Absolute 0.0  0.0 - 0.1 K/uL  COMPREHENSIVE METABOLIC PANEL      Status: Abnormal   Collection Time    01/25/13  2:25 PM      Result Value Range   Sodium 144  135 - 145 mEq/L   Potassium 4.3  3.5 - 5.1 mEq/L   Chloride 107  96 - 112 mEq/L   CO2 30  19 - 32 mEq/L   Glucose, Bld 89  70 - 99 mg/dL   BUN 35 (*) 6 - 23 mg/dL   Creatinine, Ser 7.82 (*) 0.50 - 1.10 mg/dL   Calcium 9.6  8.4 - 95.6 mg/dL   Total Protein 7.2  6.0 - 8.3 g/dL   Albumin 3.1 (*) 3.5 - 5.2 g/dL   AST 17  0 - 37 U/L   ALT 14  0 - 35 U/L   Alkaline Phosphatase 65  39 - 117 U/L   Total Bilirubin 0.2 (*) 0.3 -  1.2 mg/dL   GFR calc non Af Amer 35 (*) >90 mL/min   GFR calc Af Amer 41 (*) >90 mL/min   Comment: (NOTE)     The eGFR has been calculated using the CKD EPI equation.     This calculation has not been validated in all clinical situations.     eGFR's persistently <90 mL/min signify possible Chronic Kidney     Disease.  VALPROIC ACID LEVEL     Status: Abnormal   Collection Time    01/25/13  2:25 PM      Result Value Range   Valproic Acid Lvl 114.0 (*) 50.0 - 100.0 ug/mL   Comment: Performed at [facility]  URINALYSIS, ROUTINE W REFLEX MICROSCOPIC     Status: Abnormal   Collection Time    01/25/13  2:48 PM      Result Value Range   Color, Urine YELLOW  YELLOW   APPearance CLOUDY (*) CLEAR   Specific Gravity, Urine 1.018  1.005 - 1.030   pH 5.0  5.0 - 8.0   Glucose, UA NEGATIVE  NEGATIVE mg/dL   Hgb urine dipstick NEGATIVE  NEGATIVE   Bilirubin Urine NEGATIVE  NEGATIVE   Ketones, ur NEGATIVE  NEGATIVE mg/dL   Protein, ur NEGATIVE  NEGATIVE mg/dL   Urobilinogen, UA 0.2  0.0 - 1.0 mg/dL   Nitrite NEGATIVE  NEGATIVE   Leukocytes, UA LARGE (*) NEGATIVE  URINE MICROSCOPIC-ADD ON     Status: Abnormal   Collection Time    01/25/13  2:48 PM      Result Value Range   WBC, UA TOO NUMEROUS TO COUNT  <3 WBC/hpf   RBC / HPF 0-2  <3 RBC/hpf   Bacteria, UA MANY (*) RARE   Urine-Other AMORPHOUS URATES/PHOSPHATES      Radiological Exams on Admission: Dg Chest 1  View  01/25/2013   *RADIOLOGY REPORT*  Clinical Data: Unresponsive, altered mental status  CHEST - 1 VIEW  Comparison: 01/03/2011  Findings: Cardiomegaly is noted.  Study is limited by poor inspiration.  Central mild vascular congestion without convincing pulmonary edema.  No segmental infiltrate. Mild basilar atelectasis.  IMPRESSION: Study is limited by poor inspiration.  Central mild vascular congestion without convincing pulmonary edema.  No segmental infiltrate. Mild basilar atelectasis.   Original Report Authenticated By: Natasha Mead, M.D.    Assessment/Plan Principal Problem:   Encephalopathy acute Active Problems:   UTI (lower urinary tract infection)   ARF (acute renal failure)   Acute encephalopathy -Most likely related to effects of urinary tract infection and decreased circulatory volume in addition to possible side effects from dose adjustment of her psychotropic medications. -Please see below for details.  Urinary tract infection -Check blood and urine cultures. -Continue Rocephin pending urine culture data. -IV fluids for mild dehydration.  Acute renal failure -Presumably secondary to prerenal azotemia given decreased by mouth intake in the face of acute encephalopathy and lethargy. -Gentle IV fluids. -Recheck renal function in the morning.  DVT prophylaxis -Lovenox.  Time Spent on Admission: 75 minutes  HERNANDEZ ACOSTA,ESTELA [facility] Pager: [phone] 01/25/2013, 4:55 PM

## 2013-01-25 NOTE — ED Notes (Signed)
Family at bedside grandchildren (poa)

## 2013-01-25 NOTE — ED Notes (Signed)
Pt from [facility]

## 2013-01-25 NOTE — Progress Notes (Signed)
Utilization Review completed.  Shantia Sanford RN CM  

## 2013-01-25 NOTE — ED Notes (Signed)
Pt had increase of Depakote due to combativeness in the Nursing Home . Pt was increased on8/21. Staff has noticed the pt more lethargic and today fell asleep while eating breakfast. Pt was alert on arrival by EMS and chewing her food. Pt responds to verbal stimuli

## 2013-01-25 NOTE — ED Notes (Signed)
Bed: WA06 Expected date:  Expected time:  Means of arrival:  Comments: ems- elderly, lethargic

## 2013-01-25 NOTE — Progress Notes (Signed)
Pt confirms pcp is Florentina Jenny EPIC updated

## 2013-01-25 NOTE — ED Notes (Signed)
MD at bedside. 

## 2013-01-26 ENCOUNTER — Encounter (HOSPITAL_COMMUNITY): Payer: Self-pay

## 2013-01-26 DIAGNOSIS — R892 Abnormal level of other drugs, medicaments and biological substances in specimens from other organs, systems and tissues: Secondary | ICD-10-CM | POA: Diagnosis present

## 2013-01-26 DIAGNOSIS — E860 Dehydration: Secondary | ICD-10-CM | POA: Diagnosis present

## 2013-01-26 DIAGNOSIS — E870 Hyperosmolality and hypernatremia: Secondary | ICD-10-CM | POA: Diagnosis not present

## 2013-01-26 LAB — CBC
HCT: 33.8 % — ABNORMAL LOW (ref 36.0–46.0)
MCHC: 31.1 g/dL (ref 30.0–36.0)
MCV: 91.8 fL (ref 78.0–100.0)
RDW: 13.9 % (ref 11.5–15.5)

## 2013-01-26 LAB — BASIC METABOLIC PANEL
BUN: 35 mg/dL — ABNORMAL HIGH (ref 6–23)
CO2: 29 mEq/L (ref 19–32)
Calcium: 8.7 mg/dL (ref 8.4–10.5)
Chloride: 112 mEq/L (ref 96–112)
Creatinine, Ser: 1.17 mg/dL — ABNORMAL HIGH (ref 0.50–1.10)
GFR calc Af Amer: 46 mL/min — ABNORMAL LOW (ref >90)

## 2013-01-26 LAB — MRSA PCR SCREENING: MRSA by PCR: NEGATIVE

## 2013-01-26 MED ORDER — SODIUM CHLORIDE 0.45 % IV SOLN
INTRAVENOUS | Status: DC
  Administered 2013-01-26 – 2013-01-27 (×2): via INTRAVENOUS

## 2013-01-26 NOTE — Progress Notes (Addendum)
[facility] PROGRESS NOTE  NAKEMA FAKE OZH:[id number] DOB: 12/10/1920 DOA: 01/25/2013 PCP: Florentina Jenny, MD  Brief narrative: Rhonda Bridges is an 79 y.o. female with no significant PMH she was admitted on 01/25/2013 with altered mental status thought to be secondary to acute renal failure in the setting of a UTI.  Assessment/Plan: Principal Problem:   Encephalopathy acute -Thought to be secondary to UTI versus Depakote toxicity in the setting of acute renal failure and treatment with psychotropic medication. -Required bilateral mittens overnight to prevent removal of IV. -Continue IV fluids and supportive care. Active Problems:   Depakote toxicity -Hold Depakote and recheck level in the morning.   UTI (lower urinary tract infection) -On empiric Rocephin. Followup urine culture results.   ARF (acute renal failure) / Dehydration / Hypernatremia -Likely prerenal in etiology. Receiving gentle IV fluids. Will switch to hypotonic IV fluids given hypernatremia. -Creatinine slowly trending down on IV fluids.  Code Status: Full. Family Communication: No family at bedside.  Granddaughter Noah Delaine [phone]), message left on voice mail. Disposition Plan: SNF.   Medical Consultants:  None.  Other Consultants:  None.  Anti-infectives:  Rocephin 01/25/2013--->   HPI/Subjective: Rhonda Bridges is calm, pleasantly confused, with safety mittens on. She denies pain, dyspnea, nausea.  Objective: Filed Vitals:   01/25/13 1634 01/25/13 1802 01/25/13 2132 01/26/13 0600  BP: 101/46 140/62 109/54 113/89  Pulse: 76 80 80 82  Temp:  98 F (36.7 C) 97.9 F (36.6 C) 98.5 F (36.9 C)  TempSrc:  Oral Axillary Oral  Resp: 18 18 16    Height:  5\' 1"  (1.549 m)    Weight:  56.427 kg (124 lb 6.4 oz)    SpO2: 97% 99% 96% 94%    Intake/Output Summary (Last 24 hours) at 01/26/13 0747 Last data filed at 01/26/13 0400  Gross per 24 hour  Intake 596.25 ml  Output      0 ml  Net 596.25 ml     Exam: Gen:  NAD Cardiovascular:  RRR, No M/R/G Respiratory:  Lungs with a few scattered rhonchi Gastrointestinal:  Abdomen soft, NT/ND, + BS Extremities:  No C/E/C  Data Reviewed: Basic Metabolic Panel:  Recent Labs Lab 01/25/13 1425 01/26/13 0347  NA 144 147*  K 4.3 4.2  CL 107 112  CO2 30 29  GLUCOSE 89 68*  BUN 35* 35*  CREATININE 1.29* 1.17*  CALCIUM 9.6 8.7   GFR Estimated Creatinine Clearance: 24.1 ml/min (by C-G formula based on Cr of 1.17). Liver Function Tests:  Recent Labs Lab 01/25/13 1425  AST 17  ALT 14  ALKPHOS 65  BILITOT 0.2*  PROT 7.2  ALBUMIN 3.1*   CBC:  Recent Labs Lab 01/25/13 1425 01/26/13 0347  WBC 4.6 4.4  NEUTROABS 2.8  --   HGB 11.5* 10.5*  HCT 36.9 33.8*  MCV 92.7 91.8  PLT 327 302   Microbiology No results found for this or any previous visit (from the past 240 hour(s)).   Procedures and Diagnostic Studies:  Dg Chest 1 View 01/25/2013   *RADIOLOGY REPORT*  Clinical Data: Unresponsive, altered mental status  CHEST - 1 VIEW  Comparison: 01/03/2011  Findings: Cardiomegaly is noted.  Study is limited by poor inspiration.  Central mild vascular congestion without convincing pulmonary edema.  No segmental infiltrate. Mild basilar atelectasis.  IMPRESSION: Study is limited by poor inspiration.  Central mild vascular congestion without convincing pulmonary edema.  No segmental infiltrate. Mild basilar atelectasis.   Original Report Authenticated By:  Natasha Mead, M.D.    Scheduled Meds: . aspirin  81 mg Oral Daily  . cefTRIAXone (ROCEPHIN)  IV  1 g Intravenous Q24H  . divalproex  250 mg Oral Daily  . enoxaparin (LOVENOX) injection  30 mg Subcutaneous Q24H  . ferrous sulfate  325 mg Oral BID  . latanoprost  1 drop Both Eyes QHS  . metoprolol succinate  12.5 mg Oral BID  . multivitamins with iron  1 tablet Oral Daily  . risperiDONE  2 mg Oral QHS  . simvastatin  5 mg Oral q1800   Continuous Infusions: . sodium chloride 1,000  mL (01/25/13 2003)    Time spent: 25 minutes.   LOS: 1 day   RAMA,CHRISTINA  [facility] Pager [phone].   *Please note that the hospitalists switch teams on Wednesdays. Please call the flow manager at [phone] if you are having difficulty reaching the hospitalist taking care of this patient as she can update you and provide the most up-to-date pager number of provider caring for the patient. If 8PM-8AM, please contact night-coverage at [email or web address], password [facility]  01/26/2013, 7:47 AM

## 2013-01-26 NOTE — Progress Notes (Signed)
Bilateral mittens initiated secondary to multiple peripheral iv removals

## 2013-01-27 LAB — URINE CULTURE: Colony Count: >=100000

## 2013-01-27 LAB — BASIC METABOLIC PANEL
BUN: 30 mg/dL — ABNORMAL HIGH (ref 6–23)
Calcium: 8.5 mg/dL (ref 8.4–10.5)
GFR calc Af Amer: 56 mL/min — ABNORMAL LOW (ref >90)
GFR calc non Af Amer: 49 mL/min — ABNORMAL LOW (ref >90)
Glucose, Bld: 93 mg/dL (ref 70–99)
Potassium: 3.9 mEq/L (ref 3.5–5.1)

## 2013-01-27 MED ORDER — DIVALPROEX SODIUM 125 MG PO CPSP: 125.0000 mg | ORAL_CAPSULE | Freq: Two times a day (BID) | ORAL | Status: AC

## 2013-01-27 MED ORDER — CEFUROXIME AXETIL 250 MG PO TABS: 250.0000 mg | ORAL_TABLET | Freq: Two times a day (BID) | ORAL | Status: DC

## 2013-01-27 NOTE — Discharge Summary (Signed)
Physician Discharge Summary  Rhonda Bridges:[id number] DOB: 26-Feb-1921 DOA: 01/25/2013  PCP: Florentina Jenny, MD  Admit date: 01/25/2013 Discharge date: 01/27/2013  Recommendations for Outpatient Follow-up:  1. Note: Depakote levels elevated.  Dosage reduced to half previous dose.  Recommend closely monitoring Depakote levels if dosage adjusted up in the future. 2. Recommend F/U BMET in 3 days to ensure creatinine remains stable.  Discharge Diagnoses:  Principal Problem:    Encephalopathy acute Active Problems:    UTI (lower urinary tract infection)    ARF (acute renal failure)    Dehydration    Hypernatremia    Depakote toxicity   Discharge Condition: Improved.  Diet recommendation: Regular.  History of present illness:  Rhonda Bridges is an 79 y.o. female with no significant PMH she was admitted on 01/25/2013 with altered mental status thought to be secondary to acute renal failure in the setting of a UTI.  Of note, her depakote dosage had been increased prior to the onset of her symptoms.   Hospital Course by problem:  Principal Problem:  Encephalopathy acute  -Thought to be secondary to UTI versus Depakote toxicity in the setting of acute renal failure and treatment with psychotropic medication.  -Treated with IV fluids and supportive care. Mental status calm and appears to be at usual baseline. Active Problems:  Depakote toxicity  -Depakote held 01/26/13.  Depakote level now WNL.  Will d/c home on 1/2 prior dosage.  UTI (lower urinary tract infection)  -On empiric Rocephin. Cultures grew greater than 100,000 colonies of lactobacillus. Will send home on 3 more days of Ceftin. ARF (acute renal failure) / Dehydration / Hypernatremia  -Likely prerenal in etiology. Resolved with IVF.   Procedures:  None.  Consultations:  None.  Discharge Exam: Filed Vitals:   01/27/13 0659  BP: 137/69  Pulse: 93  Temp: 99.2 F (37.3 C)  Resp: 20   Filed Vitals:   01/26/13  0600 01/26/13 1420 01/26/13 2115 01/27/13 0659  BP: 113/89 152/63 158/54 137/69  Pulse: 82 99 93 93  Temp: 98.5 F (36.9 C) 97.6 F (36.4 C) 99.3 F (37.4 C) 99.2 F (37.3 C)  TempSrc: Oral Oral Oral Oral  Resp:  18 20 20   Height:      Weight:      SpO2: 94% 98% 98% 100%    Gen:  NAD Cardiovascular:  RRR, No M/R/G Respiratory: Lungs CTAB Gastrointestinal: Abdomen soft, NT/ND with normal active bowel sounds. Extremities: No C/E/C   Discharge Instructions     Medication List    STOP taking these medications       divalproex 250 MG 24 hr tablet  Commonly known as:  DEPAKOTE ER  Replaced by:  divalproex 125 MG capsule      TAKE these medications       acetaminophen 500 MG tablet  Commonly known as:  TYLENOL  Take 500 mg by mouth every 6 (six) hours as needed for pain.     aspirin 81 MG tablet  Take 81 mg by mouth daily.     bisacodyl 10 MG suppository  Commonly known as:  DULCOLAX  Place 10 mg rectally as needed for constipation.     cefUROXime 250 MG tablet  Commonly known as:  CEFTIN  Take 1 tablet (250 mg total) by mouth 2 (two) times daily.     [employer] SODIUM CHLORIDE 5 % ophthalmic ointment  Generic drug:  sodium chloride  Place 1 drop into the right eye at bedtime.  divalproex 125 MG capsule  Commonly known as:  DEPAKOTE SPRINKLES  Take 1 capsule (125 mg total) by mouth 2 (two) times daily.     eucerin cream  Apply 1 application topically daily.     ferrous sulfate 325 (65 FE) MG tablet  Take 325 mg by mouth 2 (two) times daily.     furosemide 20 MG tablet  Commonly known as:  LASIX  Take 10 mg by mouth daily.     guaiFENesin 600 MG 12 hr tablet  Commonly known as:  MUCINEX  Take 1,200 mg by mouth 2 (two) times daily as needed for congestion (cough).     latanoprost 0.005 % ophthalmic solution  Commonly known as:  XALATAN  Place 1 drop into both eyes at bedtime.     lisinopril 5 MG tablet  Commonly known as:  PRINIVIL,ZESTRIL  Take 5 mg  by mouth daily.     magnesium hydroxide 400 MG/5ML suspension  Commonly known as:  MILK OF MAGNESIA  Take 1 mL by mouth daily as needed for constipation.     Melatonin 3 MG Tabs  Take 3 mg by mouth at bedtime.     metoprolol succinate 25 MG 24 hr tablet  Commonly known as:  TOPROL-XL  Take 12.5 mg by mouth 2 (two) times daily.     multivitamins with iron Tabs tablet  Take 1 tablet by mouth daily.     pravastatin 10 MG tablet  Commonly known as:  PRAVACHOL  Take 10 mg by mouth daily.     risperiDONE 2 MG tablet  Commonly known as:  RISPERDAL  Take 2 mg by mouth at bedtime.          The results of significant diagnostics from this hospitalization (including imaging, microbiology, ancillary and laboratory) are listed below for reference.    Significant Diagnostic Studies: Dg Chest 1 View  01/25/2013   *RADIOLOGY REPORT*  Clinical Data: Unresponsive, altered mental status  CHEST - 1 VIEW  Comparison: 01/03/2011  Findings: Cardiomegaly is noted.  Study is limited by poor inspiration.  Central mild vascular congestion without convincing pulmonary edema.  No segmental infiltrate. Mild basilar atelectasis.  IMPRESSION: Study is limited by poor inspiration.  Central mild vascular congestion without convincing pulmonary edema.  No segmental infiltrate. Mild basilar atelectasis.   Original Report Authenticated By: Natasha Mead, M.D.    Labs:  Basic Metabolic Panel:  Recent Labs Lab 01/25/13 1425 01/26/13 0347 01/27/13 0430  NA 144 147* 141  K 4.3 4.2 3.9  CL 107 112 109  CO2 30 29 26   GLUCOSE 89 68* 93  BUN 35* 35* 30*  CREATININE 1.29* 1.17* 0.99  CALCIUM 9.6 8.7 8.5   GFR Estimated Creatinine Clearance: 28.5 ml/min (by C-G formula based on Cr of 0.99). Liver Function Tests:  Recent Labs Lab 01/25/13 1425  AST 17  ALT 14  ALKPHOS 65  BILITOT 0.2*  PROT 7.2  ALBUMIN 3.1*   CBC:  Recent Labs Lab 01/25/13 1425 01/26/13 0347  WBC 4.6 4.4  NEUTROABS 2.8  --    HGB 11.5* 10.5*  HCT 36.9 33.8*  MCV 92.7 91.8  PLT 327 302   Depakote Levels: Results for KAHLEN, BOYDE (MRN [id number]) as of 01/27/2013 14:49  Ref. Range 01/25/2013 14:25 01/27/2013 04:30  Valproic Acid Lvl Latest Range: 50.0-100.0 ug/mL 114.0 (H) 51.8   Microbiology Recent Results (from the past 240 hour(s))  URINE CULTURE     Status: None   Collection Time  01/25/13  2:48 PM      Result Value Range Status   Specimen Description URINE, CATHETERIZED   Final   Special Requests NONE   Final   Culture  Setup Time     Final   Value: 01/26/2013 01:08     Performed at [facility]   Colony Count     Final   Value: >=100,000 COLONIES/ML     Performed at [facility]   Culture     Final   Value: LACTOBACILLUS SPECIES     Note: Standardized susceptibility testing for this organism is not available.     Performed at [facility]   Report Status 01/27/2013 FINAL   Final  MRSA PCR SCREENING     Status: None   Collection Time    01/26/13  5:26 AM      Result Value Range Status   MRSA by PCR NEGATIVE  NEGATIVE Final   Comment:            The GeneXpert MRSA Assay (FDA     approved for NASAL specimens     only), is one component of a     comprehensive MRSA colonization     surveillance program. It is not     intended to diagnose MRSA     infection nor to guide or     monitor treatment for     MRSA infections.    Time coordinating discharge: 35 minutes.  Signed:  Ark Agrusa  Pager [phone] [facility] 01/27/2013, 2:44 PM

## 2013-01-27 NOTE — Care Management Note (Signed)
    Page 1 of 1   01/27/2013     4:51:25 PM   CARE MANAGEMENT NOTE 01/27/2013  Patient:  Rhonda Bridges, Rhonda Bridges   Account Number:  [id number]  Date Initiated:  01/27/2013  Documentation initiated by:  [facility]  Subjective/Objective Assessment:   79 Y/O F ADMITTED W/ACUTE ENCEPHALOPATHY.     Action/Plan:   FROM [facility]   Anticipated DC Date:  01/31/2013   Anticipated DC Plan:  ASSISTED LIVING / REST HOME      DC Planning Services  CM consult      PAC Choice  Resumption Of Svcs/PTA Provider   Choice offered to / List presented to:             Status of service:  Completed, signed off Medicare Important Message given?   (If response is "NO", the following Medicare IM given date fields will be blank) Date Medicare IM given:   Date Additional Medicare IM given:    Discharge Disposition:  ASSISTED LIVING  Per UR Regulation:  Reviewed for med. necessity/level of care/duration of stay  If discussed at Long Length of Stay Meetings, dates discussed:    Comments:  01/27/13 Lanier Clam RN,BSN NCM WEEKEND [phone] [facility] PROVIDES OWN CONTRACT HHC.

## 2013-01-27 NOTE — Evaluation (Addendum)
Physical Therapy Evaluation Patient Details Name: Rhonda Bridges MRN: [id number] DOB: [birth year] Today's Date: 01/27/2013 Time: 9528-4132 PT Time Calculation (min): 24 min  PT Assessment / Plan / Recommendation History of Present Illness  Patient is a 79 year old woman who resides at skilled nursing facility, who is brought in today at the urging of her granddaughter secondary to increased confusion. Granddaughter states that about 10 days ago they doubled the dose of her Depakote and that about 3 days later she started noticing her decline. Patient is usually alert enough to where she paddles around in her wheelchair and is able to play bingo, however she has not been able to do this for the past several days. Workup in the emergency department is significant for a urinary tract infection and acute renal failure  Clinical Impression  Recommend  Return to ALF setting if they can manage her at this level (?close to baseline)No further needs this venue;    PT Assessment  All further PT needs can be met in the next venue of care    Follow Up Recommendations  ALF/24hr assist    Does the patient have the potential to tolerate intense rehabilitation      Barriers to Discharge        Equipment Recommendations       Recommendations for Other Services     Frequency      Precautions / Restrictions Precautions Precautions: Fall   Pertinent Vitals/Pain No c/o pain;       Mobility  Bed Mobility Bed Mobility: Supine to Sit;Sitting - Scoot to [place] of Bed;Sit to Supine;Rolling Right;Rolling Left Rolling Right: 1: +1 Total assist Rolling Left: 1: +1 Total assist Supine to Sit: 1: +1 Total assist Sitting - Scoot to Edge of Bed: 1: +1 Total assist Sit to Supine: 1: +2 Total assist Sit to Supine: Patient Percentage: 0% Details for Bed Mobility Assistance: total assist for UB and LEs Transfers Transfers: Not assessed    Exercises     PT Diagnosis: Generalized weakness  PT Problem List:    PT Treatment Interventions:          Visit Information  Last PT Received On: 01/27/13 Assistance Needed: +2 History of Present Illness: Patient is a 79 year old woman who resides at skilled nursing facility, who is brought in today at the urging of her granddaughter secondary to increased confusion. Granddaughter states that about 10 days ago they doubled the dose of her Depakote and that about 3 days later she started noticing her decline. Patient is usually alert enough to where she paddles around in her wheelchair and is able to play bingo, however she has not been able to do this for the past several days. Workup in the emergency department is significant for a urinary tract infection and acute renal failure       Prior Functioning  Home Living Family/patient expects to be discharged to:: Skilled nursing facility Prior Function Level of Independence: Needs assistance Comments: per RN pt was assisted by staff to w/c at facility, pt was mostly dependent with transfers    Cognition  Cognition Arousal/Alertness: Lethargic Behavior During Therapy: Flat affect Overall Cognitive Status: No family/caregiver present to determine baseline cognitive functioning Pt follows some verbal commands (~50% accuracy); she is lethargic and oriented only to self    Extremity/Trunk Assessment Lower Extremity Assessment Lower Extremity Assessment: LLE deficits/detail;RLE deficits/detail RLE Deficits / Details: knee flexion contracture of ~ 30degrees;  LLE Deficits / Details: as above   Balance Static  Sitting Balance Static Sitting - Balance Support: Left upper extremity supported;Feet supported;No upper extremity supported Static Sitting - Level of Assistance: 2: Max assist;3: Mod assist;1: +1 Total assist Static Sitting - Comment/# of Minutes: pt with LOB to right, pushing with LUE until Lhand removed from bed; variable levels of assist required while sitting EOB; head/neck flexed foward and to  right, PT able to passively range out of this position improving overall balance and posture; pt  able to intiate some correction with verbal cues after stretching  End of Session PT - End of Session Activity Tolerance: Patient tolerated treatment well Patient left: in bed;with call bell/phone within reach;with bed alarm set Nurse Communication: Mobility status  GP     [facility] 01/27/2013, 1:41 PM

## 2013-01-27 NOTE — Progress Notes (Signed)
[facility] PROGRESS NOTE  JAYME MEDNICK ZOX:[id number] DOB: January 13, 1921 DOA: 01/25/2013 PCP: Florentina Jenny, MD  Brief narrative: Rhonda Bridges is an 79 y.o. female with no significant PMH she was admitted on 01/25/2013 with altered mental status thought to be secondary to acute renal failure in the setting of a UTI.  Assessment/Plan: Principal Problem:   Encephalopathy acute -Thought to be secondary to UTI versus Depakote toxicity in the setting of acute renal failure and treatment with psychotropic medication. -Required bilateral mittens overnight to prevent removal of IV. -Continue IV fluids and supportive care. Active Problems:   Depakote toxicity -Hold Depakote and recheck level in the morning.   UTI (lower urinary tract infection) -On empiric Rocephin. Cultures grew greater than 100,000 colonies of lactobacillus.   ARF (acute renal failure) / Dehydration / Hypernatremia -Likely prerenal in etiology. Continue gentle IV fluids.  -Hypernatremia corrected with hypotonic IV fluids. -Creatinine continues to trend down.  Code Status: Full. Family Communication: No family at bedside.  Granddaughter Noah Delaine [phone]), message left on voice mail. Disposition Plan: SNF.   Medical Consultants:  None.  Other Consultants:  None.  Anti-infectives:  Rocephin 01/25/2013--->   HPI/Subjective: Rhonda Bridges is calm, pleasantly confused, with safety mittens on. She denies pain, dyspnea, nausea.  No appetite.  Objective: Filed Vitals:   01/26/13 0600 01/26/13 1420 01/26/13 2115 01/27/13 0659  BP: 113/89 152/63 158/54 137/69  Pulse: 82 99 93 93  Temp: 98.5 F (36.9 C) 97.6 F (36.4 C) 99.3 F (37.4 C) 99.2 F (37.3 C)  TempSrc: Oral Oral Oral Oral  Resp:  18 20 20   Height:      Weight:      SpO2: 94% 98% 98% 100%    Intake/Output Summary (Last 24 hours) at 01/27/13 0950 Last data filed at 01/27/13 0600  Gross per 24 hour  Intake   3495 ml  Output      0 ml  Net    3495 ml    Exam: Gen:  NAD Cardiovascular:  RRR, II/VI SEM Respiratory:  Lungs CTAB Gastrointestinal:  Abdomen soft, NT/ND, + BS Extremities:  No C/E/C  Data Reviewed: Basic Metabolic Panel:  Recent Labs Lab 01/25/13 1425 01/26/13 0347 01/27/13 0430  NA 144 147* 141  K 4.3 4.2 3.9  CL 107 112 109  CO2 30 29 26   GLUCOSE 89 68* 93  BUN 35* 35* 30*  CREATININE 1.29* 1.17* 0.99  CALCIUM 9.6 8.7 8.5   GFR Estimated Creatinine Clearance: 28.5 ml/min (by C-G formula based on Cr of 0.99). Liver Function Tests:  Recent Labs Lab 01/25/13 1425  AST 17  ALT 14  ALKPHOS 65  BILITOT 0.2*  PROT 7.2  ALBUMIN 3.1*   CBC:  Recent Labs Lab 01/25/13 1425 01/26/13 0347  WBC 4.6 4.4  NEUTROABS 2.8  --   HGB 11.5* 10.5*  HCT 36.9 33.8*  MCV 92.7 91.8  PLT 327 302   Microbiology Recent Results (from the past 240 hour(s))  URINE CULTURE     Status: None   Collection Time    01/25/13  2:48 PM      Result Value Range Status   Specimen Description URINE, CATHETERIZED   Final   Special Requests NONE   Final   Culture  Setup Time     Final   Value: 01/26/2013 01:08     Performed at [facility] Count     Final   Value: >=100,000 COLONIES/ML  Performed at [facility]     Final   Value: LACTOBACILLUS SPECIES     Note: Standardized susceptibility testing for this organism is not available.     Performed at [facility]   Report Status 01/27/2013 FINAL   Final  MRSA PCR SCREENING     Status: None   Collection Time    01/26/13  5:26 AM      Result Value Range Status   MRSA by PCR NEGATIVE  NEGATIVE Final   Comment:            The GeneXpert MRSA Assay (FDA     approved for NASAL specimens     only), is one component of a     comprehensive MRSA colonization     surveillance program. It is not     intended to diagnose MRSA     infection nor to guide or     monitor treatment for     MRSA infections.     Procedures  and Diagnostic Studies:  Dg Chest 1 View 01/25/2013   *RADIOLOGY REPORT*  Clinical Data: Unresponsive, altered mental status  CHEST - 1 VIEW  Comparison: 01/03/2011  Findings: Cardiomegaly is noted.  Study is limited by poor inspiration.  Central mild vascular congestion without convincing pulmonary edema.  No segmental infiltrate. Mild basilar atelectasis.  IMPRESSION: Study is limited by poor inspiration.  Central mild vascular congestion without convincing pulmonary edema.  No segmental infiltrate. Mild basilar atelectasis.   Original Report Authenticated By: Natasha Mead, M.D.    Scheduled Meds: . aspirin  81 mg Oral Daily  . cefTRIAXone (ROCEPHIN)  IV  1 g Intravenous Q24H  . enoxaparin (LOVENOX) injection  30 mg Subcutaneous Q24H  . ferrous sulfate  325 mg Oral BID  . latanoprost  1 drop Both Eyes QHS  . metoprolol succinate  12.5 mg Oral BID  . multivitamins with iron  1 tablet Oral Daily  . risperiDONE  2 mg Oral QHS  . simvastatin  5 mg Oral q1800   Continuous Infusions: . sodium chloride 100 mL/hr at 01/27/13 0725    Time spent: 25 minutes.   LOS: 2 days   RAMA,CHRISTINA  [facility] Pager [phone].   *Please note that the hospitalists switch teams on Wednesdays. Please call the flow manager at [phone] if you are having difficulty reaching the hospitalist taking care of this patient as she can update you and provide the most up-to-date pager number of provider caring for the patient. If 8PM-8AM, please contact night-coverage at [email or web address], password [facility]  01/27/2013, 9:50 AM

## 2013-01-30 NOTE — Clinical Social Work Psychosocial (Addendum)
    Clinical Social Work Department BRIEF PSYCHOSOCIAL ASSESSMENT 01/30/2013  Patient:  Rhonda Bridges, Rhonda Bridges     Account Number:  [id number]     Admit date:  01/25/2013  Clinical Social Worker:  Tiburcio Pea  Date/Time:  01/27/2013 05:00 PM  Referred by:  Physician  Date Referred:  01/27/2013 Referred for  Other - See comment   Other Referral:   Return to ALF   Interview type:  Other - See comment Other interview type:   Patient (quite confused) and family    PSYCHOSOCIAL DATA Living Status:  FACILITY Admitted from facility:  [facility] Level of care:  Assisted Living Primary support name:  Darrel Reach   [phone] Primary support relationship to patient:  FAMILY Degree of support available:   Granddaughter- good support    CURRENT CONCERNS Current Concerns  Other - See comment   Other Concerns:   return to ALF vs PT's recommendation for SNF    SOCIAL WORK ASSESSMENT / PLAN 79 year old female- resident of [facility]- ALF level of care. CSW received referral per PT's recommendation for SNF.  Per MD- patient is medically stable for d/c today (Sunday). Patient is pleasantly confused and wanted CSW to talk to her granddaughter Darrel Reach.  CSW contacted Ms. Clementeen Graham regarding above SNF recommendation but she ADAMANTLY refuses SNF and insists that patient return to [facility]. CSW spoke multiple times with ALF staff member Verlon Au and [facility]- they stated that patient is a long term resident there and they will accept her back. She has not been away from their facility for more than 72 hours- thus a reassessment is not required. CSW notified nursing and MD;  grandduaghter will transport patient. Fl2 completed and MD signature obtained.RNCM will arrange for Home Health   Assessment/plan status:  No Further Intervention Required Other assessment/ plan:   Information/referral to community resources:   None    PATIENT'S/FAMILY'S RESPONSE TO PLAN OF CARE: Patient  is alert but pleasantly confused; unable to fully respond to plan of care but stated she wanted to return to [facility]. Granddaughter is emphatic that she return there and fully supports this d/c plan. OK per MD for d/c today back to facility. No further CSW needs identiifed. Nursing notified and CSW signing off.  Lorri Frederick. Iyona Pehrson, LCSWA  [phone]

## 2013-03-18 ENCOUNTER — Encounter (HOSPITAL_COMMUNITY): Payer: Self-pay | Admitting: Emergency Medicine

## 2013-03-18 ENCOUNTER — Emergency Department (HOSPITAL_COMMUNITY)
Admission: EM | Admit: 2013-03-18 | Discharge: 2013-03-18 | Disposition: A | Discharge status: Home / Self Care | Payer: Medicare Other | Attending: Emergency Medicine | Admitting: Emergency Medicine

## 2013-03-18 ENCOUNTER — Emergency Department (HOSPITAL_COMMUNITY): Payer: Medicare Other

## 2013-03-18 DIAGNOSIS — R4182 Altered mental status, unspecified: Secondary | ICD-10-CM | POA: Insufficient documentation

## 2013-03-18 DIAGNOSIS — Z7982 Long term (current) use of aspirin: Secondary | ICD-10-CM | POA: Insufficient documentation

## 2013-03-18 DIAGNOSIS — Z79899 Other long term (current) drug therapy: Secondary | ICD-10-CM | POA: Insufficient documentation

## 2013-03-18 DIAGNOSIS — F0390 Unspecified dementia without behavioral disturbance: Secondary | ICD-10-CM | POA: Insufficient documentation

## 2013-03-18 DIAGNOSIS — I10 Essential (primary) hypertension: Secondary | ICD-10-CM | POA: Insufficient documentation

## 2013-03-18 DIAGNOSIS — N390 Urinary tract infection, site not specified: Secondary | ICD-10-CM | POA: Insufficient documentation

## 2013-03-18 DIAGNOSIS — Z792 Long term (current) use of antibiotics: Secondary | ICD-10-CM | POA: Insufficient documentation

## 2013-03-18 HISTORY — DX: Essential (primary) hypertension: I10

## 2013-03-18 LAB — CBC WITH DIFFERENTIAL/PLATELET
Basophils Relative: 0 % (ref 0–1)
Eosinophils Absolute: 0.2 10*3/uL (ref 0.0–0.7)
Eosinophils Relative: 3 % (ref 0–5)
Lymphs Abs: 1.4 10*3/uL (ref 0.7–4.0)
MCH: 29.5 pg (ref 26.0–34.0)
MCHC: 32.3 g/dL (ref 30.0–36.0)
MCV: 91.3 fL (ref 78.0–100.0)
Neutrophils Relative %: 65 % (ref 43–77)
Platelets: 472 10*3/uL — ABNORMAL HIGH (ref 150–400)
RBC: 4 MIL/uL (ref 3.87–5.11)
RDW: 13.8 % (ref 11.5–15.5)

## 2013-03-18 LAB — COMPREHENSIVE METABOLIC PANEL
ALT: 14 U/L (ref 0–35)
Albumin: 3.3 g/dL — ABNORMAL LOW (ref 3.5–5.2)
Calcium: 9.6 mg/dL (ref 8.4–10.5)
GFR calc Af Amer: 51 mL/min — ABNORMAL LOW (ref >90)
Glucose, Bld: 96 mg/dL (ref 70–99)
Potassium: 4.1 mEq/L (ref 3.5–5.1)
Sodium: 146 mEq/L — ABNORMAL HIGH (ref 135–145)
Total Protein: 7.2 g/dL (ref 6.0–8.3)

## 2013-03-18 LAB — URINALYSIS, ROUTINE W REFLEX MICROSCOPIC
Bilirubin Urine: NEGATIVE
Ketones, ur: NEGATIVE mg/dL
Nitrite: POSITIVE — AB
Protein, ur: 100 mg/dL — AB
Specific Gravity, Urine: 1.022 (ref 1.005–1.030)
Urobilinogen, UA: 0.2 mg/dL (ref 0.0–1.0)

## 2013-03-18 LAB — URINE MICROSCOPIC-ADD ON

## 2013-03-18 MED ORDER — DEXTROSE 5 % IV SOLN
1.0000 g | Freq: Once | INTRAVENOUS | Status: AC
  Administered 2013-03-18: 1 g via INTRAVENOUS
  Filled 2013-03-18: qty 10

## 2013-03-18 MED ORDER — CEPHALEXIN 500 MG PO CAPS: 500.0000 mg | ORAL_CAPSULE | Freq: Two times a day (BID) | ORAL | Status: DC

## 2013-03-18 NOTE — ED Provider Notes (Signed)
CSN: [id number]     Arrival date & time 03/18/13  1336 History   First MD Initiated Contact with Patient 03/18/13 1453     Chief Complaint  Patient presents with  . Altered Mental Status   (Consider location/radiation/quality/duration/timing/severity/associated sxs/prior Treatment) HPI  This is a 79 yo female with history of hypertension and dementia who presents from her nursing home with concerns for altered mental status. Patient presents with her granddaughter. Granddaughter provides most of the history as the patient is somnolent and has dementia. Per the patient's granddaughter, she was called because the patient was not "been herself." EMS was called out to the living facilities. He reported to the granddaughter that her vital signs were stable. Granddaughter states that she's just not as talkative as she normally is and she told more difficult to wake up. She has not noted any focal deficits. She states that when the patient I hear she did wake up and tell her that her stomach hurt.  Past Medical History  Diagnosis Date  . Dementia   . Hypertension    History reviewed. No pertinent past surgical history. No family history on file. History  Substance Use Topics  . Smoking status: Never Smoker   . Smokeless tobacco: Not on file  . Alcohol Use: No   OB History   Grav Para Term Preterm Abortions TAB SAB Ect Mult Living                 Review of Systems  Unable to perform ROS: Dementia    Allergies  Review of patient's allergies indicates no known allergies.  Home Medications   Current Outpatient Rx  Name  Route  Sig  Dispense  Refill  . acetaminophen (TYLENOL) 500 MG tablet   Oral   Take 500 mg by mouth every 6 (six) hours as needed for pain.         Marland Kitchen aspirin 81 MG tablet   Oral   Take 81 mg by mouth daily.         . bisacodyl (DULCOLAX) 10 MG suppository   Rectal   Place 10 mg rectally as needed for constipation.         . cefUROXime (CEFTIN) 250 MG  tablet   Oral   Take 1 tablet (250 mg total) by mouth 2 (two) times daily.   6 tablet   0   . cephALEXin (KEFLEX) 500 MG capsule   Oral   Take 1 capsule (500 mg total) by mouth 2 (two) times daily.   14 capsule   0   . divalproex (DEPAKOTE SPRINKLES) 125 MG capsule   Oral   Take 1 capsule (125 mg total) by mouth 2 (two) times daily.   30 capsule   0   . ferrous sulfate 325 (65 FE) MG tablet   Oral   Take 325 mg by mouth 2 (two) times daily.         . furosemide (LASIX) 20 MG tablet   Oral   Take 10 mg by mouth daily.         Marland Kitchen guaiFENesin (MUCINEX) 600 MG 12 hr tablet   Oral   Take 1,200 mg by mouth 2 (two) times daily as needed for congestion (cough).         . latanoprost (XALATAN) 0.005 % ophthalmic solution   Both Eyes   Place 1 drop into both eyes at bedtime.         Marland Kitchen lisinopril (PRINIVIL,ZESTRIL) 5 MG  tablet   Oral   Take 5 mg by mouth daily.         . magnesium hydroxide (MILK OF MAGNESIA) 400 MG/5ML suspension   Oral   Take 1 mL by mouth daily as needed for constipation.         . Melatonin 3 MG TABS   Oral   Take 3 mg by mouth at bedtime.         . metoprolol succinate (TOPROL-XL) 25 MG 24 hr tablet   Oral   Take 12.5 mg by mouth 2 (two) times daily.         . Multiple Vitamins-Iron (MULTIVITAMINS WITH IRON) TABS tablet   Oral   Take 1 tablet by mouth daily.         . pravastatin (PRAVACHOL) 10 MG tablet   Oral   Take 10 mg by mouth daily.         . risperiDONE (RISPERDAL) 2 MG tablet   Oral   Take 2 mg by mouth at bedtime.         . Skin Protectants, Misc. (EUCERIN) cream   Topical   Apply 1 application topically daily.         . sodium chloride ([employer] SODIUM CHLORIDE) 5 % ophthalmic ointment   Right Eye   Place 1 drop into the right eye at bedtime.          BP 117/52  Pulse 78  Temp(Src) 98.7 F (37.1 C) (Oral)  Resp 14  SpO2 97% Physical Exam  Nursing note and vitals reviewed. Constitutional:   Elderly, somnolent, no acute distress  HENT:  Head: Normocephalic and atraumatic.  Mucous membranes dry  Eyes: Pupils are equal, round, and reactive to light.  Cardiovascular: Normal rate, regular rhythm and normal heart sounds.   No murmur heard. Pulmonary/Chest: Effort normal and breath sounds normal. No respiratory distress.  Abdominal: Soft. Bowel sounds are normal. There is no tenderness.  Musculoskeletal: She exhibits no edema.  Neurological:  Somnolent but arousable, moves all 4 extremities spontaneously  Skin: Skin is warm and dry. No rash noted.    ED Course  Procedures (including critical care time) Labs Review Labs Reviewed  URINALYSIS, ROUTINE W REFLEX MICROSCOPIC - Abnormal; Notable for the following:    APPearance TURBID (*)    Hgb urine dipstick MODERATE (*)    Protein, ur 100 (*)    Nitrite POSITIVE (*)    Leukocytes, UA LARGE (*)    All other components within normal limits  CBC WITH DIFFERENTIAL - Abnormal; Notable for the following:    Hemoglobin 11.8 (*)    Platelets 472 (*)    All other components within normal limits  COMPREHENSIVE METABOLIC PANEL - Abnormal; Notable for the following:    Sodium 146 (*)    BUN 30 (*)    Albumin 3.3 (*)    GFR calc non Af Amer 44 (*)    GFR calc Af Amer 51 (*)    All other components within normal limits  URINE MICROSCOPIC-ADD ON - Abnormal; Notable for the following:    Bacteria, UA FEW (*)    All other components within normal limits  URINE CULTURE   Imaging Review Ct Head Wo Contrast  03/18/2013   CLINICAL DATA:  Altered mental status.  EXAM: CT HEAD WITHOUT CONTRAST  TECHNIQUE: Contiguous axial images were obtained from the base of the skull through the vertex without intravenous contrast.  COMPARISON:  02/03/2010.  FINDINGS: No intracranial hemorrhage.  Moderate small vessel disease type changes without CT evidence of large acute infarct.  Global atrophy. Ventricular prominence unchanged and probably related to  atrophy rather than hydrocephalus.  No intracranial mass lesion noted on this unenhanced exam.  .  Vascular calcifications.  IMPRESSION: No intracranial hemorrhage or CT evidence of large acute infarct.   Electronically Signed   By: Bridgett Larsson M.D.   On: 03/18/2013 15:36    EKG Interpretation   None       MDM   1. Altered mental status   2. Urinary tract infection    This is a 79 year old female who presents with somnolence and altered mental status. She is nontoxic-appearing on exam. She is somnolent but arousable. Lab work notable for acute urinary tract infection. Patient was given Rocephin. CT scan of the head is negative. Upon reevaluation the patient, she is more alert and able to answer my questions. The granddaughter states that she is at her baseline. She's been afebrile here and her other lab work is reassuring and without evidence of acute kidney injury. She does appear well dry. She was able to tolerate oral intake prior to discharge. She will be given Keflex as an outpatient. Granddaugher given strict return precautions.  After history, exam, and medical workup I feel the patient has been appropriately medically screened and is safe for discharge home. Pertinent diagnoses were discussed with the patient. Patient was given return precautions.   Shon Baton, MD 03/19/13 Marlyne Beards

## 2013-03-18 NOTE — ED Notes (Signed)
Pt lives in assisted living and this am she was "not herself".  Per family member she was not responding this morning and EMS was called and checked pt and VSS, grand daughter decided to transport pt herself.  Pt became more responsive on arrival to ED and told family that her stomach had been bothering her.  Pt is alert on arrival to room 5 and responding to me, oriented to self and per family she is back to her usual self.

## 2013-03-18 NOTE — ED Notes (Signed)
I&O cath pt.  Sterile technique.  Cloudy urine returned

## 2013-03-20 LAB — URINE CULTURE: Colony Count: >=100000

## 2015-08-10 ENCOUNTER — Non-Acute Institutional Stay (SKILLED_NURSING_FACILITY): Payer: Medicare Other | Admitting: Internal Medicine

## 2015-08-10 ENCOUNTER — Encounter: Payer: Self-pay | Admitting: Internal Medicine

## 2015-08-10 DIAGNOSIS — J410 Simple chronic bronchitis: Secondary | ICD-10-CM | POA: Diagnosis not present

## 2015-08-10 DIAGNOSIS — F0280 Dementia in other diseases classified elsewhere without behavioral disturbance: Secondary | ICD-10-CM | POA: Diagnosis not present

## 2015-08-10 DIAGNOSIS — G309 Alzheimer's disease, unspecified: Secondary | ICD-10-CM | POA: Diagnosis not present

## 2015-08-10 DIAGNOSIS — K5900 Constipation, unspecified: Secondary | ICD-10-CM | POA: Insufficient documentation

## 2015-08-10 DIAGNOSIS — R159 Full incontinence of feces: Secondary | ICD-10-CM | POA: Diagnosis not present

## 2015-08-10 DIAGNOSIS — R32 Unspecified urinary incontinence: Secondary | ICD-10-CM

## 2015-08-10 DIAGNOSIS — F329 Major depressive disorder, single episode, unspecified: Secondary | ICD-10-CM | POA: Insufficient documentation

## 2015-08-10 DIAGNOSIS — I739 Peripheral vascular disease, unspecified: Secondary | ICD-10-CM | POA: Diagnosis not present

## 2015-08-10 DIAGNOSIS — H409 Unspecified glaucoma: Secondary | ICD-10-CM | POA: Diagnosis not present

## 2015-08-10 DIAGNOSIS — K5901 Slow transit constipation: Secondary | ICD-10-CM | POA: Diagnosis not present

## 2015-08-10 DIAGNOSIS — G4700 Insomnia, unspecified: Secondary | ICD-10-CM | POA: Diagnosis not present

## 2015-08-10 DIAGNOSIS — I10 Essential (primary) hypertension: Secondary | ICD-10-CM

## 2015-08-10 DIAGNOSIS — R4701 Aphasia: Secondary | ICD-10-CM

## 2015-08-10 DIAGNOSIS — F3342 Major depressive disorder, recurrent, in full remission: Secondary | ICD-10-CM | POA: Diagnosis not present

## 2015-08-10 DIAGNOSIS — J449 Chronic obstructive pulmonary disease, unspecified: Secondary | ICD-10-CM | POA: Insufficient documentation

## 2015-08-10 NOTE — Progress Notes (Signed)
Provider:  Murray Hodgkins  MD Location:  [facility] Room Number: 109-B Place of Service:  SNF (31)  PCP: Kimber Relic, MD Patient Care Team: Kimber Relic, MD as PCP - General (Internal Medicine) Florentina Jenny, MD as Referring Physician [facility])  Extended Emergency Contact Information Primary Emergency Contact: Curry,Patricia Address: [address]          [place], [place] [address] D[place] of [place] Home Phone: [phone] Mobile Phone: [phone] Relation: Grandaughter Secondary Emergency Contact: Jodi Mourning Address: [address]          [place], [place] [address] D[place] of [place] Home Phone: [phone] Relation: Daughter  Code Status: DNR Goals of Care: Advanced Directive information Advanced Directives 08/10/2015  Does patient have an advance directive? Yes  Type of Advance Directive Out of facility DNR (pink MOST or yellow form)  Does patient want to make changes to advanced directive? No - Patient declined  Copy of advanced directive(s) in chart? No - copy requested  Pre-existing out of facility DNR order (yellow form or pink MOST form) Yellow form placed in chart (order not valid for inpatient use)      Chief Complaint  Patient presents with  . New Admit To SNF    HPI: Patient is a 82 y.o. female seen today for admission to [facility] from a previous assisted living facility, [facility]. Patient's last hospitalization was in 2014 encephalopathy associated with urinary tract infection.  Current problems include Alzheimer's disease with aphasia, hypertension, COPD, peripheral vascular disease, depression, incontinence of bowel or bladder, chronic constipation, insomnia, and glaucoma.  There are virtually no records accompanying the patient other than the FL2. It appears that she has moved here for long-term care.  Past Medical History  Diagnosis Date  . Alzheimer's dementia   . Hypertension   .  Expressive aphasia   . Depression, major (HCC)   . COPD (chronic obstructive pulmonary disease) (HCC)   . Peripheral vascular disease (HCC)   . Incontinent of urine   . Fecal incontinence   . Constipation   . Insomnia   . Glaucoma    Past Surgical History  Procedure Laterality Date  . Total hip arthroplasty Left 2010    reports that she has never smoked. She does not have any smokeless tobacco history on file. She reports that she does not drink alcohol or use illicit drugs. Social History   Social History  . Marital Status: Legally Separated    Spouse Name: N/A  . Number of Children: N/A  . Years of Education: N/A   Occupational History  . Not on file.   Social History Main Topics  . Smoking status: Never Smoker   . Smokeless tobacco: Not on file  . Alcohol Use: No  . Drug Use: No  . Sexual Activity: Not on file   Other Topics Concern  . Not on file   Social History Narrative    No family history on file.  There are no preventive care reminders to display for this patient.  No Known Allergies    Medication List       This list is accurate as of: 08/10/15 11:27 AM.  Always use your most recent med list.               acetaminophen 500 MG tablet  Commonly known as:  TYLENOL  Take 500 mg by mouth every 8 (eight) hours as needed.     aspirin 81  MG tablet  Take 81 mg by mouth daily.     bisacodyl 10 MG suppository  Commonly known as:  DULCOLAX  Place 10 mg rectally daily as needed.     Cranberry 405 MG Caps  Take 1 capsule by mouth 2 (two) times daily.     divalproex 125 MG capsule  Commonly known as:  DEPAKOTE SPRINKLES  Take 1 capsule (125 mg total) by mouth 2 (two) times daily.     feeding supplement (PRO-STAT SUGAR FREE 64) Liqd  Take 30 mLs by mouth daily.     ipratropium-albuterol 0.5-2.5 (3) MG/3ML Soln  Commonly known as:  DUONEB  Take 3 mLs by nebulization every 6 (six) hours as needed. For wheezing     latanoprost 0.005 % ophthalmic  solution  Commonly known as:  XALATAN  Place 1 drop into both eyes at bedtime.     lisinopril 5 MG tablet  Commonly known as:  PRINIVIL,ZESTRIL  Take 5 mg by mouth daily.     Melatonin 1 MG Tabs  Take 1 mg by mouth at bedtime.     metoprolol succinate 25 MG 24 hr tablet  Commonly known as:  TOPROL-XL  Take 12.5 mg by mouth 2 (two) times daily.     multivitamins with iron Tabs tablet  Take 1 tablet by mouth daily.     senna-docusate 8.6-50 MG tablet  Commonly known as:  Senokot-S  Take 1 tablet by mouth every morning.        Review of Systems  Constitutional: Negative for fever, chills, diaphoresis, activity change, appetite change, fatigue and unexpected weight change.       Elderly and frail  HENT: Negative for congestion, ear discharge, ear pain, hearing loss, postnasal drip, rhinorrhea, sore throat, tinnitus, trouble swallowing and voice change.        Edentulous  Eyes: Negative for pain, redness, itching and visual disturbance.  Respiratory: Negative for cough, choking, shortness of breath and wheezing.        History COPD. Breathing easily at this time.  Cardiovascular: Negative for chest pain, palpitations and leg swelling.       History hypertension and peripheral vascular disease.  Gastrointestinal: Negative for nausea, abdominal pain, diarrhea, constipation and abdominal distention.       Eating and swallowing well without evidence of choking. Fecal incontinence.  Endocrine: Negative for cold intolerance, heat intolerance, polydipsia, polyphagia and polyuria.  Genitourinary: Negative for dysuria, urgency, frequency, hematuria, flank pain, vaginal discharge, difficulty urinating and pelvic pain.       Urinary incontinence by history  Musculoskeletal: Negative for myalgias, back pain, arthralgias, gait problem, neck pain and neck stiffness.  Skin: Negative for color change, pallor and rash.  Allergic/Immunologic: Negative.   Neurological: Negative for dizziness,  tremors, seizures, syncope, weakness, numbness and headaches.       History of Alzheimer's dementia  Hematological: Negative for adenopathy. Does not bruise/bleed easily.  Psychiatric/Behavioral: Positive for confusion and sleep disturbance. Negative for suicidal ideas, hallucinations, behavioral problems, dysphoric mood and agitation. The patient is not nervous/anxious and is not hyperactive.     Filed Vitals:   08/10/15 1126  BP: 142/70  Pulse: 70  Temp: 98.7 F (37.1 C)  TempSrc: Oral  Resp: 16   There is no weight on file to calculate BMI. Physical Exam  Constitutional: She is oriented to person, place, and time. She appears well-developed. No distress.  HENT:  Right Ear: External ear normal.  Left Ear: External ear normal.  Nose:  Nose normal.  Mouth/Throat: Oropharynx is clear and moist. No oropharyngeal exudate.  Eyes: Conjunctivae and EOM are normal. Pupils are equal, round, and reactive to light. No scleral icterus.  Neck: No JVD present. No tracheal deviation present. No thyromegaly present.  Cardiovascular: Normal rate, regular rhythm and normal heart sounds.  Exam reveals no gallop and no friction rub.   No murmur heard. Diminished DP and PT bilaterally  Pulmonary/Chest: Effort normal. No respiratory distress. She has no wheezes. She has no rales. She exhibits no tenderness.  Abdominal: She exhibits no distension and no mass. There is no tenderness.  Musculoskeletal: Normal range of motion. She exhibits no edema or tenderness.  generalized weakness  Lymphadenopathy:    She has no cervical adenopathy.  Neurological: She is alert and oriented to person, place, and time. No cranial nerve deficit. Coordination normal.  Severely demented. Limited responses verbally.  Skin: No rash noted. She is not diaphoretic. No erythema. No pallor.  Scar left hip from previous replacement  Psychiatric: Her behavior is normal.  Avoids eye contact. Busy eating..    Labs  reviewed:  Lab Results  Component Value Date   TSH 1.905 *Test methodology is 3rd generation TSH* 07/26/2009   No results found for: VITAMINB12 No results found for: FOLATE No results found for: IRON, TIBC, FERRITIN  Imaging and Procedures obtained prior to SNF admission: Ct Head Wo Contrast  03/18/2013  CLINICAL DATA:  Altered mental status. EXAM: CT HEAD WITHOUT CONTRAST TECHNIQUE: Contiguous axial images were obtained from the base of the skull through the vertex without intravenous contrast. COMPARISON:  02/03/2010. FINDINGS: No intracranial hemorrhage. Moderate small vessel disease type changes without CT evidence of large acute infarct. Global atrophy. Ventricular prominence unchanged and probably related to atrophy rather than hydrocephalus. No intracranial mass lesion noted on this unenhanced exam.  . Vascular calcifications. IMPRESSION: No intracranial hemorrhage or CT evidence of large acute infarct. Electronically Signed   By: Bridgett Larsson M.D.   On: 03/18/2013 15:36    Assessment/Plan 1. Alzheimer's dementia Continue to observe. Not currently treated with any medications.  2. Essential hypertension Stable on current medication  3. Expressive aphasia Related to dementia  4. Recurrent major depressive disorder, in full remission (HCC) She is not currently on any antidepressants  5. Simple chronic bronchitis (HCC) Under control. No wheeze or cough.  6. Peripheral vascular disease (HCC) Stable  7. Urinary incontinence, unspecified incontinence type Adult incontinence diapers  8. Fecal incontinence Adult incontinence diapers  9. Slow transit constipation Senokot-S Dulcolax as needed  10. Insomnia Depakote twice daily  11. Glaucoma Xalatan eyedrops   Labs/tests ordered:  CBC, CMP, TSH, folate

## 2015-08-12 ENCOUNTER — Encounter (HOSPITAL_COMMUNITY): Payer: Self-pay

## 2015-08-12 ENCOUNTER — Emergency Department (HOSPITAL_COMMUNITY): Payer: Medicare Other

## 2015-08-12 ENCOUNTER — Emergency Department (HOSPITAL_COMMUNITY)
Admission: EM | Admit: 2015-08-12 | Discharge: 2015-08-12 | Disposition: A | Discharge status: Home / Self Care | Payer: Medicare Other | Attending: Emergency Medicine | Admitting: Emergency Medicine

## 2015-08-12 DIAGNOSIS — J690 Pneumonitis due to inhalation of food and vomit: Secondary | ICD-10-CM

## 2015-08-12 DIAGNOSIS — F0280 Dementia in other diseases classified elsewhere without behavioral disturbance: Secondary | ICD-10-CM | POA: Insufficient documentation

## 2015-08-12 DIAGNOSIS — Y9289 Other specified places as the place of occurrence of the external cause: Secondary | ICD-10-CM | POA: Insufficient documentation

## 2015-08-12 DIAGNOSIS — Y998 Other external cause status: Secondary | ICD-10-CM | POA: Diagnosis not present

## 2015-08-12 DIAGNOSIS — J698 Pneumonitis due to inhalation of other solids and liquids: Secondary | ICD-10-CM | POA: Insufficient documentation

## 2015-08-12 DIAGNOSIS — Z008 Encounter for other general examination: Secondary | ICD-10-CM | POA: Diagnosis present

## 2015-08-12 DIAGNOSIS — Z79899 Other long term (current) drug therapy: Secondary | ICD-10-CM | POA: Diagnosis not present

## 2015-08-12 DIAGNOSIS — I10 Essential (primary) hypertension: Secondary | ICD-10-CM | POA: Diagnosis not present

## 2015-08-12 DIAGNOSIS — G309 Alzheimer's disease, unspecified: Secondary | ICD-10-CM | POA: Insufficient documentation

## 2015-08-12 DIAGNOSIS — K5900 Constipation, unspecified: Secondary | ICD-10-CM | POA: Insufficient documentation

## 2015-08-12 DIAGNOSIS — T189XXA Foreign body of alimentary tract, part unspecified, initial encounter: Secondary | ICD-10-CM

## 2015-08-12 DIAGNOSIS — X58XXXA Exposure to other specified factors, initial encounter: Secondary | ICD-10-CM | POA: Diagnosis not present

## 2015-08-12 DIAGNOSIS — Y9389 Activity, other specified: Secondary | ICD-10-CM | POA: Diagnosis not present

## 2015-08-12 DIAGNOSIS — H409 Unspecified glaucoma: Secondary | ICD-10-CM | POA: Diagnosis not present

## 2015-08-12 DIAGNOSIS — T65891A Toxic effect of other specified substances, accidental (unintentional), initial encounter: Secondary | ICD-10-CM | POA: Insufficient documentation

## 2015-08-12 DIAGNOSIS — G4700 Insomnia, unspecified: Secondary | ICD-10-CM | POA: Insufficient documentation

## 2015-08-12 MED ORDER — CLINDAMYCIN HCL 300 MG PO CAPS: 600.0000 mg | ORAL_CAPSULE | Freq: Three times a day (TID) | ORAL | Status: DC

## 2015-08-12 MED ORDER — LEVOFLOXACIN 750 MG PO TABS: 750.0000 mg | ORAL_TABLET | Freq: Every day | ORAL | Status: DC

## 2015-08-12 NOTE — ED Provider Notes (Signed)
CSN: [id number]     Arrival date & time 08/12/15  1140 History   First MD Initiated Contact with Patient 08/12/15 1148     Chief Complaint  Patient presents with  . Medical Clearance   (Consider location/radiation/quality/duration/timing/severity/associated sxs/prior Treatment) HPI 82 y.o. female with a hx of Dementia, [facility] to staff witnessed diaper particles in patient's mouth. Sent to ED for evaluation of ingestion. Pt has been inhabitant of facility since Friday.   Level V Caveat due to Dementia  Past Medical History  Diagnosis Date  . Alzheimer's dementia   . Hypertension   . Expressive aphasia   . Depression, major (HCC)   . COPD (chronic obstructive pulmonary disease) (HCC)   . Peripheral vascular disease (HCC)   . Incontinent of urine   . Fecal incontinence   . Constipation   . Insomnia   . Glaucoma    Past Surgical History  Procedure Laterality Date  . Total hip arthroplasty Left 2010   No family history on file. Social History  Substance Use Topics  . Smoking status: Never Smoker   . Smokeless tobacco: None  . Alcohol Use: No   OB History    No data available     Review of Systems  Unable to perform ROS: Dementia   Allergies  Review of patient's allergies indicates no known allergies.  Home Medications   Prior to Admission medications   Medication Sig Start Date End Date Taking? Authorizing Provider  acetaminophen (TYLENOL) 500 MG tablet Take 500 mg by mouth every 8 (eight) hours as needed.     Historical Provider, MD  Amino Acids-Protein Hydrolys (FEEDING SUPPLEMENT, PRO-STAT SUGAR FREE 64,) LIQD Take 30 mLs by mouth daily.    Historical Provider, MD  aspirin 81 MG tablet Take 81 mg by mouth daily.    Historical Provider, MD  bisacodyl (DULCOLAX) 10 MG suppository Place 10 mg rectally daily as needed.     Historical Provider, MD  Cranberry 405 MG CAPS Take 1 capsule by mouth 2 (two) times  daily.    Historical Provider, MD  divalproex (DEPAKOTE SPRINKLES) 125 MG capsule Take 1 capsule (125 mg total) by mouth 2 (two) times daily. 01/27/13   Christina P Rama, MD  ipratropium-albuterol (DUONEB) 0.5-2.5 (3) MG/3ML SOLN Take 3 mLs by nebulization every 6 (six) hours as needed. For wheezing    Historical Provider, MD  latanoprost (XALATAN) 0.005 % ophthalmic solution Place 1 drop into both eyes at bedtime.    Historical Provider, MD  lisinopril (PRINIVIL,ZESTRIL) 5 MG tablet Take 5 mg by mouth daily.    Historical Provider, MD  Melatonin 1 MG TABS Take 1 mg by mouth at bedtime.    Historical Provider, MD  metoprolol succinate (TOPROL-XL) 25 MG 24 hr tablet Take 12.5 mg by mouth 2 (two) times daily.    Historical Provider, MD  Multiple Vitamins-Iron (MULTIVITAMINS WITH IRON) TABS tablet Take 1 tablet by mouth daily.    Historical Provider, MD  senna-docusate (SENOKOT-S) 8.6-50 MG tablet Take 1 tablet by mouth every morning.    Historical Provider, MD   BP 159/74 mmHg  Pulse 83  Temp(Src) 98.2 F (36.8 C) (Oral)  Resp 16  SpO2 98%   Physical Exam  Constitutional: She is oriented to person, place, and time. She appears well-developed and well-nourished.  HENT:  Head: Normocephalic and atraumatic.  Mouth/Throat: Uvula is midline, oropharynx is clear and moist and mucous membranes are normal.  No trismus in the jaw. No uvula swelling. No oropharyngeal exudate, posterior oropharyngeal edema, posterior oropharyngeal erythema or tonsillar abscesses.  No foreign bodies noted in oropharynx. Pt has no dentition.   Eyes: EOM are normal. Pupils are equal, round, and reactive to light.  Neck: Trachea normal and normal range of motion. Neck supple. No tracheal tenderness present. No tracheal deviation present.  Cardiovascular: Normal rate and regular rhythm.   Pulmonary/Chest: Effort normal and breath sounds normal. No stridor. She has no decreased breath sounds. She has no wheezes. She has no  rhonchi. She has no rales.  Abdominal: Soft.  Musculoskeletal: Normal range of motion.  Neurological: She is alert and oriented to person, place, and time.  Skin: Skin is warm and dry.  Psychiatric: She has a normal mood and affect. Her behavior is normal. Thought content normal.  Nursing note and vitals reviewed.   ED Course  Procedures (including critical care time) Labs Review Labs Reviewed - No data to display  Imaging Review Dg Chest 2 View  08/12/2015  CLINICAL DATA:  82 year old female with a history of cough. EXAM: CHEST - 2 VIEW COMPARISON:  01/25/2013, 01/03/2011 FINDINGS: Cardiomediastinal silhouette unchanged with cardiomegaly. Tortuosity of descending thoracic aorta. Bilateral interstitial opacities with patchy opacities at the lung bases. No pneumothorax. Blunting of the left costophrenic angle, potentially pleural fluid. Degenerative changes of the spine. Osteopenia. Degenerative changes of the shoulders. IMPRESSION: Ill-defined interstitial and airspace opacities, potentially early edema or multifocal infection given the history. Likely small pleural effusion on the left. Atherosclerosis. Signed, Yvone Neu. Loreta Ave, DO [facility] Electronically Signed   By: Gilmer Mor D.O.   On: 08/12/2015 16:06   I have personally reviewed and evaluated these images and lab results as part of my medical decision-making.   EKG Interpretation None      MDM  I have reviewed the relevant previous healthcare records. I reviewed and evaluated relevant imaging. I obtained HPI from historian. Patient discussed with supervising physician  ED Course:  Assessment: Pt is a 89yF with hx Dementia who presents today from [facility] due to ingestion of foreign body. On exam, pt in NAD. Nontoxic/nonseptic appearing. VSS. 98% O2 saturation. Afebrile. No visible foreign body on examination of oropharynx. No stridor noted. Lungs CTA. Heart RRR.  Abdomen nontender soft. CXR showed ill defined interstitial airspace opacities. On discussion with attending, we decided to treat with ABX for CAP as well as Aspiration Pneumonia. Pt does not meet SIRS/Sepsis criteria. Pt stable for outpatient treatment. Plan is to DC back to facility for further management of care. At time of discharge, Patient is in no acute distress. Vital Signs are stable. Patient is able to ambulate. Patient able to tolerate PO.    Disposition/Plan:  DC Home Additional Verbal discharge instructions given and discussed with patient.  Pt Instructed to f/u with PCP in the next 48-72 hours for evaluation and treatment of symptoms. Return precautions given Pt acknowledges and agrees with plan  Supervising Physician Lavera Guise, MD   Final diagnoses:  Ingestion of foreign body, initial encounter  Aspiration pneumonia of both lungs, unspecified aspiration pneumonia type, unspecified part of lung [facility])       Audry Pili, PA-C 08/12/15 1712  Lavera Guise, MD 08/12/15 1715

## 2015-08-12 NOTE — Discharge Instructions (Signed)
Please read and follow all provided instructions.  Your diagnoses today include:  1. Ingestion of foreign body, initial encounter   2. Aspiration pneumonia of both lungs, unspecified aspiration pneumonia type, unspecified part of lung (HCC)    Tests performed today include:  Vital signs. See below for your results today.   Medications prescribed:   Take medication as prescribed    Home care instructions:  Follow any educational materials contained in this packet.  Follow-up instructions: Please follow-up with your primary care provider for further management of any symptoms  Return instructions:   Please return to the Emergency Department if you do not get better, if you get worse, or new symptoms OR  - Fever (temperature greater than 101.59F)  - Bleeding that does not stop with holding pressure to the area    -Severe pain (please note that you may be more sore the day after your accident)  - Chest Pain  - Difficulty breathing  - Severe nausea or vomiting  - Inability to tolerate food and liquids  - Passing out  - Skin becoming red around your wounds  - Change in mental status (confusion or lethargy)  - New numbness or weakness     Please return if you have any other emergent concerns.  Additional Information:  Your vital signs today were: BP 122/96 mmHg   Pulse 94   Temp(Src) 98.2 F (36.8 C) (Oral)   Resp 16   SpO2 97% If your blood pressure (BP) was elevated above 135/85 this visit, please have this repeated by your doctor within one month. ---------------

## 2015-08-12 NOTE — ED Notes (Signed)
Alpha RN at [facility] made aware RX X 2 needed to be started today.

## 2015-08-12 NOTE — ED Notes (Addendum)
Per PTAR. Pt resides at [facility]. FULL CODE. Per staff witness ?diaper paticles  in pt's mouth therefor request evaluation. Pt also has cough. Pt is not oriented however will follow simple commands. Pt presents with moderate contractions. Pt has only been at this facility since Friday.

## 2015-08-12 NOTE — ED Notes (Signed)
Bed: WA09 Expected date:  Expected time:  Means of arrival:  Comments: 82 year old, eating diaper at facility, needs evaluation- room 9

## 2015-08-12 NOTE — ED Notes (Signed)
ED PA at bedside

## 2015-08-12 NOTE — ED Notes (Signed)
PTAR CALLED  °

## 2015-09-02 ENCOUNTER — Encounter: Payer: Self-pay | Admitting: Nurse Practitioner

## 2015-09-02 ENCOUNTER — Non-Acute Institutional Stay (SKILLED_NURSING_FACILITY): Payer: Medicare Other | Admitting: Nurse Practitioner

## 2015-09-02 DIAGNOSIS — I10 Essential (primary) hypertension: Secondary | ICD-10-CM | POA: Diagnosis not present

## 2015-09-02 DIAGNOSIS — F0280 Dementia in other diseases classified elsewhere without behavioral disturbance: Secondary | ICD-10-CM

## 2015-09-02 DIAGNOSIS — F320 Major depressive disorder, single episode, mild: Secondary | ICD-10-CM

## 2015-09-02 DIAGNOSIS — J410 Simple chronic bronchitis: Secondary | ICD-10-CM | POA: Diagnosis not present

## 2015-09-02 DIAGNOSIS — K5900 Constipation, unspecified: Secondary | ICD-10-CM

## 2015-09-02 DIAGNOSIS — G309 Alzheimer's disease, unspecified: Secondary | ICD-10-CM | POA: Diagnosis not present

## 2015-09-02 NOTE — Assessment & Plan Note (Signed)
Stable, continue DuoNeb q6h prn  

## 2015-09-02 NOTE — Assessment & Plan Note (Signed)
Blood pressure is controlled, continue Lisinopril 5mg  daily, Metoprolol 12.5mg  bid. Update CMP

## 2015-09-02 NOTE — Assessment & Plan Note (Signed)
Stable, continue Ducolax 10mg pr prn, Senna S I qd  

## 2015-09-02 NOTE — Assessment & Plan Note (Signed)
Mood is stabilized, continue Depakote 125mg bid.   

## 2015-09-02 NOTE — Progress Notes (Signed)
Patient ID: Rhonda Bridges, female   DOB: 12-23-1920, 82 y.o.   MRN: [id number]  Location:  [facility] Room Number: 109B Place of Service:  SNF (31) Provider: Arna Snipe Ayiden Milliman NP  GREEN, [name], MD  Patient Care Team: Kimber Relic, MD as PCP - General (Internal Medicine) Florentina Jenny, MD as Referring Physician [facility] Medicine)  Extended Emergency Contact Information Primary Emergency Contact: Curry,Patricia Address: [address]          [place], [place] [address] D[place] of [place] Home Phone: [phone] Mobile Phone: [phone] Relation: Grandaughter Secondary Emergency Contact: Jodi Mourning Address: [address]          [place], [place] [address] Darden Amber of [place] Home Phone: [phone] Relation: Daughter  Code Status:  DNR Goals of care: Advanced Directive information Advanced Directives 09/02/2015  Does patient have an advance directive? No  Type of Advance Directive -  Does patient want to make changes to advanced directive? No - Patient declined  Copy of advanced directive(s) in chart? No - copy requested  Pre-existing out of facility DNR order (yellow form or pink MOST form) -     Chief Complaint  Patient presents with  . Medical Management of Chronic Issues    Routine Visit    HPI:  Pt is a 82 y.o. female seen today for medical management of chronic diseases.  Hx of alzheimer's dementia, resides in SNF for care needs, her mood and behaviors are managed with Depakote  bid, blood pressure is controlled while on Lisinopril  qd and Metoprolol 12.5mg  bid. Prn DuoNeb for cough and SOB related to her hx of COPD.    Past Medical History  Diagnosis Date  . Alzheimer's dementia   . Hypertension   . Expressive aphasia   . Depression, major (HCC)   . COPD (chronic obstructive pulmonary disease) (HCC)   . Peripheral vascular disease (HCC)   . Incontinent of urine   . Fecal incontinence   . Constipation   . Insomnia   .  Glaucoma    Past Surgical History  Procedure Laterality Date  . Total hip arthroplasty Left 2010    No Known Allergies    Medication List       This list is accurate as of: 09/02/15 11:59 PM.  Always use your most recent med list.               acetaminophen 500 MG tablet  Commonly known as:  TYLENOL  Take 500 mg by mouth every 8 (eight) hours as needed.     aspirin 81 MG tablet  Take 81 mg by mouth daily.     bisacodyl 10 MG suppository  Commonly known as:  DULCOLAX  Place 10 mg rectally daily as needed.     Cranberry 405 MG Caps  Take 1 capsule by mouth 2 (two) times daily.     divalproex 125 MG capsule  Commonly known as:  DEPAKOTE SPRINKLES  Take 1 capsule (125 mg total) by mouth 2 (two) times daily.     feeding supplement (PRO-STAT SUGAR FREE 64) Liqd  Take 30 mLs by mouth daily.     ipratropium-albuterol 0.5-2.5 (3) MG/3ML Soln  Commonly known as:  DUONEB  Take 3 mLs by nebulization every 6 (six) hours as needed. For wheezing     latanoprost 0.005 % ophthalmic solution  Commonly known as:  XALATAN  Place 1 drop into both eyes at bedtime.     lisinopril 5  MG tablet  Commonly known as:  PRINIVIL,ZESTRIL  Take 5 mg by mouth daily.     Melatonin 1 MG Tabs  Take 1 mg by mouth at bedtime.     metoprolol succinate 25 MG 24 hr tablet  Commonly known as:  TOPROL-XL  Take 12.5 mg by mouth 2 (two) times daily.     multivitamins with iron Tabs tablet  Take 1 tablet by mouth daily.     senna-docusate 8.6-50 MG tablet  Commonly known as:  Senokot-S  Take 1 tablet by mouth every morning.        Review of Systems  Constitutional: Negative for fever, chills, diaphoresis, activity change, appetite change, fatigue and unexpected weight change.       Elderly and frail  HENT: Negative for congestion, ear discharge, ear pain, hearing loss, postnasal drip, rhinorrhea, sore throat, tinnitus, trouble swallowing and voice change.        Edentulous  Eyes: Negative  for pain, redness, itching and visual disturbance.  Respiratory: Negative for cough, choking, shortness of breath and wheezing.        History COPD. Breathing easily at this time.  Cardiovascular: Negative for chest pain, palpitations and leg swelling.       History hypertension and peripheral vascular disease.  Gastrointestinal: Negative for nausea, abdominal pain, diarrhea, constipation and abdominal distention.       Eating and swallowing well without evidence of choking. Fecal incontinence.  Endocrine: Negative for cold intolerance, heat intolerance, polydipsia, polyphagia and polyuria.  Genitourinary: Negative for dysuria, urgency, frequency, hematuria, flank pain, vaginal discharge, difficulty urinating and pelvic pain.       Urinary incontinence by history  Musculoskeletal: Negative for myalgias, back pain, arthralgias, gait problem, neck pain and neck stiffness.  Skin: Negative for color change, pallor and rash.  Allergic/Immunologic: Negative.   Neurological: Negative for dizziness, tremors, seizures, syncope, weakness, numbness and headaches.       History of Alzheimer's dementia  Hematological: Negative for adenopathy. Does not bruise/bleed easily.  Psychiatric/Behavioral: Positive for confusion and sleep disturbance. Negative for suicidal ideas, hallucinations, behavioral problems, dysphoric mood and agitation. The patient is not nervous/anxious and is not hyperactive.      There is no immunization history on file for this patient. Pertinent  Health Maintenance Due  Topic Date Due  . DEXA SCAN  10/01/1987  . PNA vac Low Risk Adult (1 of 2 - PCV13) 10/01/1987  . INFLUENZA VACCINE  12/29/2015   No flowsheet data found. Functional Status Survey:    Filed Vitals:   09/02/15 1144  BP: 138/80  Pulse: 76  Temp: 98.4 F (36.9 C)  TempSrc: Oral  Resp: 20  Height:  (1.676 m)  Weight: 115 lb (52.164 kg)  SpO2: 92%   Body mass index is 18.57 kg/(m^2). Physical Exam    Constitutional: She is oriented to person, place, and time. She appears well-developed. No distress.  HENT:  Right Ear: External ear normal.  Left Ear: External ear normal.  Nose: Nose normal.  Mouth/Throat: Oropharynx is clear and moist. No oropharyngeal exudate.  Eyes: Conjunctivae and EOM are normal. Pupils are equal, round, and reactive to light. No scleral icterus.  Neck: No JVD present. No tracheal deviation present. No thyromegaly present.  Cardiovascular: Normal rate, regular rhythm and normal heart sounds.  Exam reveals no gallop and no friction rub.   No murmur heard. Diminished DP and PT bilaterally  Pulmonary/Chest: Effort normal. No respiratory distress. She has no wheezes. She has  no rales. She exhibits no tenderness.  Abdominal: She exhibits no distension and no mass. There is no tenderness.  Musculoskeletal: Normal range of motion. She exhibits no edema or tenderness.  generalized weakness  Lymphadenopathy:    She has no cervical adenopathy.  Neurological: She is alert and oriented to person, place, and time. No cranial nerve deficit. Coordination normal.  Severely demented. Limited responses verbally.  Skin: No rash noted. She is not diaphoretic. No erythema. No pallor.  Scar left hip from previous replacement  Psychiatric: Her behavior is normal.  Avoids eye contact.     Labs reviewed: No results for input(s): NA, K, CL, CO2, GLUCOSE, BUN, CREATININE, CALCIUM, MG, PHOS in the last 8760 hours. No results for input(s): AST, ALT, ALKPHOS, BILITOT, PROT, ALBUMIN in the last 8760 hours. No results for input(s): WBC, NEUTROABS, HGB, HCT, MCV, PLT in the last 8760 hours.  No results found for: HGBA1C No results found for: CHOL, HDL, LDLCALC, LDLDIRECT, TRIG, CHOLHDL  Significant Diagnostic Results in last 30 days:  Dg Chest 2 View  08/12/2015  CLINICAL DATA:  82 year old female with a history of cough. EXAM: CHEST - 2 VIEW COMPARISON:  01/25/2013, 01/03/2011 FINDINGS:  Cardiomediastinal silhouette unchanged with cardiomegaly. Tortuosity of descending thoracic aorta. Bilateral interstitial opacities with patchy opacities at the lung bases. No pneumothorax. Blunting of the left costophrenic angle, potentially pleural fluid. Degenerative changes of the spine. Osteopenia. Degenerative changes of the shoulders. IMPRESSION: Ill-defined interstitial and airspace opacities, potentially early edema or multifocal infection given the history. Likely small pleural effusion on the left. Atherosclerosis. Signed, Yvone NeuJaime S. Loreta AveWagner, DO [facility] Electronically Signed   By: Gilmer MorJaime  Wagner D.O.   On: 08/12/2015 16:06    Assessment/Plan  Alzheimer's dementia SNF for care needs, Depakote 125mg  bid to stabilize mood and manage behaviors. Update CBC, CMP, Hgb a1c, TSH  Constipation Stable, continue Ducolax 10mg  pr prn, Senna S I qd  COPD (chronic obstructive pulmonary disease) (HCC) Stable, continue DuoNeb q6h prn  Hypertension Blood pressure is controlled, continue Lisinopril 5mg  daily, Metoprolol 12.5mg  bid. Update CMP  Depression, major (HCC) Mood is stabilized, continue Depakote 125mg  bid.     Family/ staff Communication: continue SNF for care needs   Labs/tests ordered: CBC, CMP, TSH, Hgb a1c

## 2015-09-02 NOTE — Assessment & Plan Note (Addendum)
SNF for care needs, Depakote 125mg  bid to stabilize mood and manage behaviors. Update CBC, CMP, Hgb a1c, TSH

## 2015-09-09 LAB — HEPATIC FUNCTION PANEL
ALT: 13 U/L (ref 7–35)
Alkaline Phosphatase: 70 U/L (ref 25–125)
Bilirubin, Total: 0.4 mg/dL

## 2015-09-09 LAB — CBC AND DIFFERENTIAL
HEMATOCRIT: 33 % — AB (ref 36–46)
HEMOGLOBIN: 9.7 g/dL — AB (ref 12.0–16.0)
PLATELETS: 226 10*3/uL (ref 150–399)
WBC: 3.8 10*3/mL

## 2015-09-09 LAB — BASIC METABOLIC PANEL
BUN: 29 mg/dL — AB (ref 4–21)
Creatinine: 1.2 mg/dL — AB (ref 0.5–1.1)
GLUCOSE: 83 mg/dL
Sodium: 139 mmol/L (ref 137–147)

## 2015-09-09 LAB — TSH: TSH: 5.37 u[IU]/mL (ref 0.41–5.90)

## 2015-09-09 LAB — HEMOGLOBIN A1C: HEMOGLOBIN A1C: 5.6

## 2015-09-16 ENCOUNTER — Encounter: Payer: Self-pay | Admitting: Nurse Practitioner

## 2015-09-16 ENCOUNTER — Non-Acute Institutional Stay (SKILLED_NURSING_FACILITY): Payer: Medicare Other | Admitting: Nurse Practitioner

## 2015-09-16 DIAGNOSIS — M129 Arthropathy, unspecified: Secondary | ICD-10-CM | POA: Diagnosis not present

## 2015-09-16 DIAGNOSIS — K5900 Constipation, unspecified: Secondary | ICD-10-CM | POA: Diagnosis not present

## 2015-09-16 DIAGNOSIS — J410 Simple chronic bronchitis: Secondary | ICD-10-CM

## 2015-09-16 DIAGNOSIS — F320 Major depressive disorder, single episode, mild: Secondary | ICD-10-CM | POA: Diagnosis not present

## 2015-09-16 DIAGNOSIS — I10 Essential (primary) hypertension: Secondary | ICD-10-CM

## 2015-09-16 DIAGNOSIS — R7989 Other specified abnormal findings of blood chemistry: Secondary | ICD-10-CM

## 2015-09-16 DIAGNOSIS — N189 Chronic kidney disease, unspecified: Secondary | ICD-10-CM

## 2015-09-16 DIAGNOSIS — R946 Abnormal results of thyroid function studies: Secondary | ICD-10-CM | POA: Diagnosis not present

## 2015-09-16 DIAGNOSIS — D631 Anemia in chronic kidney disease: Secondary | ICD-10-CM | POA: Diagnosis not present

## 2015-09-16 NOTE — Progress Notes (Signed)
Patient ID: Rhonda Bridges, female   DOB: 03-May-1921, 82 y.o.   MRN: [id number]  Location:  [facility] Room Number: 109 B Place of Service:  SNF (31) Provider: Arna Snipe Aahan Marques NP  GREEN, [name], MD  Patient Care Team: Kimber Relic, MD as PCP - General (Internal Medicine) Florentina Jenny, MD as Referring Physician [facility] Medicine)  Extended Emergency Contact Information Primary Emergency Contact: Curry,Patricia Address: [address]          [place], [place] [address] Darden Amber of [place] Home Phone: [phone] Mobile Phone: [phone] Relation: Grandaughter Secondary Emergency Contact: Jodi Mourning Address: [address]          [place], [place] [address] Darden [place] of [place] Home Phone: [phone] Relation: Daughter  Code Status:  DNR Goals of care: Advanced Directive information Advanced Directives 09/16/2015  Does patient have an advance directive? No  Does patient want to make changes to advanced directive? No - Patient declined  Copy of advanced directive(s) in chart? No - copy requested     Chief Complaint  Patient presents with  . Medication Management    Depakote    HPI:  Pt is a 82 y.o. female seen today for medical management of chronic diseases.  Hx of alzheimer's dementia, resides in SNF for care needs, her mood and behaviors are managed with Depakote  bid, blood pressure is controlled while on Lisinopril  qd and Metoprolol 12.5mg  bid. Prn DuoNeb for cough and SOB related to her hx of COPD.    Past Medical History  Diagnosis Date  . Alzheimer's dementia   . Hypertension   . Expressive aphasia   . Depression, major (HCC)   . COPD (chronic obstructive pulmonary disease) (HCC)   . Peripheral vascular disease (HCC)   . Incontinent of urine   . Fecal incontinence   . Constipation   . Insomnia   . Glaucoma    Past Surgical History  Procedure Laterality Date  . Total hip arthroplasty Left 2010    No Known  Allergies    Medication List       This list is accurate as of: 09/16/15 11:59 PM.  Always use your most recent med list.               acetaminophen 500 MG tablet  Commonly known as:  TYLENOL  Take 500 mg by mouth every 8 (eight) hours as needed (Take one tab by mouth twice daily).     aspirin 81 MG tablet  Take 81 mg by mouth daily.     bisacodyl 10 MG suppository  Commonly known as:  DULCOLAX  Place 10 mg rectally daily as needed.     Cranberry 405 MG Caps  Take 1 capsule by mouth 2 (two) times daily.     divalproex 125 MG capsule  Commonly known as:  DEPAKOTE SPRINKLES  Take 1 capsule (125 mg total) by mouth 2 (two) times daily.     feeding supplement (PRO-STAT SUGAR FREE 64) Liqd  Take 30 mLs by mouth daily.     ipratropium-albuterol 0.5-2.5 (3) MG/3ML Soln  Commonly known as:  DUONEB  Take 3 mLs by nebulization every 6 (six) hours as needed. For wheezing     latanoprost 0.005 % ophthalmic solution  Commonly known as:  XALATAN  Place 1 drop into both eyes at bedtime.     lisinopril 5 MG tablet  Commonly known as:  PRINIVIL,ZESTRIL  Take 5 mg by mouth daily.  Melatonin 1 MG Tabs  Take 1 mg by mouth at bedtime.     metoprolol succinate 25 MG 24 hr tablet  Commonly known as:  TOPROL-XL  Take 12.5 mg by mouth 2 (two) times daily.     multivitamins with iron Tabs tablet  Take 1 tablet by mouth daily.     senna-docusate 8.6-50 MG tablet  Commonly known as:  Senokot-S  Take 1 tablet by mouth every morning.        Review of Systems  Constitutional: Negative for fever, chills, diaphoresis, activity change, appetite change, fatigue and unexpected weight change.       Elderly and frail  HENT: Negative for congestion, ear discharge, ear pain, hearing loss, postnasal drip, rhinorrhea, sore throat, tinnitus, trouble swallowing and voice change.        Edentulous  Eyes: Negative for pain, redness, itching and visual disturbance.  Respiratory: Negative for  cough, choking, shortness of breath and wheezing.        History COPD. Breathing easily at this time.  Cardiovascular: Negative for chest pain, palpitations and leg swelling.       History hypertension and peripheral vascular disease.  Gastrointestinal: Negative for nausea, abdominal pain, diarrhea, constipation and abdominal distention.       Eating and swallowing well without evidence of choking. Fecal incontinence.  Endocrine: Negative for cold intolerance, heat intolerance, polydipsia, polyphagia and polyuria.  Genitourinary: Negative for dysuria, urgency, frequency, hematuria, flank pain, vaginal discharge, difficulty urinating and pelvic pain.       Urinary incontinence by history  Musculoskeletal: Negative for myalgias, back pain, arthralgias, gait problem, neck pain and neck stiffness.  Skin: Negative for color change, pallor and rash.  Allergic/Immunologic: Negative.   Neurological: Negative for dizziness, tremors, seizures, syncope, weakness, numbness and headaches.       History of Alzheimer's dementia  Hematological: Negative for adenopathy. Does not bruise/bleed easily.  Psychiatric/Behavioral: Positive for confusion and sleep disturbance. Negative for suicidal ideas, hallucinations, behavioral problems, dysphoric mood and agitation. The patient is not nervous/anxious and is not hyperactive.     Immunization History  Administered Date(s) Administered  . PPD Test 08/09/2015   Pertinent  Health Maintenance Due  Topic Date Due  . DEXA SCAN  10/01/1987  . PNA vac Low Risk Adult (1 of 2 - PCV13) 10/01/1987  . INFLUENZA VACCINE  12/29/2015   No flowsheet data found. Functional Status Survey:    Filed Vitals:   09/16/15 1123  BP: 124/70  Pulse: 62  Temp: 98.1 F (36.7 C)  TempSrc: Oral  Resp: 18  Height:  (1.676 m)  Weight: 116 lb (52.617 kg)  SpO2: 92%   Body mass index is 18.73 kg/(m^2). Physical Exam  Constitutional: She is oriented to person, place, and  time. She appears well-developed. No distress.  HENT:  Right Ear: External ear normal.  Left Ear: External ear normal.  Nose: Nose normal.  Mouth/Throat: Oropharynx is clear and moist. No oropharyngeal exudate.  Eyes: Conjunctivae and EOM are normal. Pupils are equal, round, and reactive to light. No scleral icterus.  Neck: No JVD present. No tracheal deviation present. No thyromegaly present.  Cardiovascular: Normal rate, regular rhythm and normal heart sounds.  Exam reveals no gallop and no friction rub.   No murmur heard. Diminished DP and PT bilaterally  Pulmonary/Chest: Effort normal. No respiratory distress. She has no wheezes. She has no rales. She exhibits no tenderness.  Abdominal: She exhibits no distension and no mass. There is no  tenderness.  Musculoskeletal: Normal range of motion. She exhibits no edema or tenderness.  generalized weakness  Lymphadenopathy:    She has no cervical adenopathy.  Neurological: She is alert and oriented to person, place, and time. No cranial nerve deficit. Coordination normal.  Severely demented. Limited responses verbally.  Skin: No rash noted. She is not diaphoretic. No erythema. No pallor.  Scar left hip from previous replacement  Psychiatric: Her behavior is normal.  Avoids eye contact.     Labs reviewed:  Recent Labs  09/09/15  NA 139  BUN 29*  CREATININE 1.2*    Recent Labs  09/09/15  ALT 13  ALKPHOS 70    Recent Labs  09/09/15  WBC 3.8  HGB 9.7*  HCT 33*  PLT 226    Lab Results  Component Value Date   HGBA1C 5.6 09/09/2015   No results found for: CHOL, HDL, LDLCALC, LDLDIRECT, TRIG, CHOLHDL  Significant Diagnostic Results in last 30 days:  No results found.  Assessment/Plan  Alzheimer's dementia SNF for care needs, Depakote 125mg  bid to stabilize mood and manage behaviors  Arthritis involving multiple sites Continue Tylenol 500mg  bid and prn   Constipation Stable, continue Ducolax 10mg  pr prn, Senna S I  qd   COPD (chronic obstructive pulmonary disease) (HCC) Stable, continue DuoNeb q6h prn   Depression, major (HCC) Mood is stabilized, continue Depakote 125mg  bid.    Hypertension Blood pressure is controlled, continue Lisinopril 5mg  daily, Metoprolol 12.5mg  bid. 09/09/15 Na 139, Bun 29, creat 1.24  Elevated TSH 09/09/15 TSH 5.368, update TSH in 3 months  Anemia in chronic kidney disease 09/09/15 Hgb 9.7 09/16/15 Fe sat, Iron, B12, Folate, Retic count, Ferritin, TIBC. Also TSH and CBC in 3 months.      Family/ staff Communication: continue SNF for care needs   Labs/tests ordered: anemia panel. CBC and TSH in 3 months

## 2015-09-16 NOTE — Assessment & Plan Note (Signed)
SNF for care needs, Depakote 125mg  bid to stabilize mood and manage behaviors

## 2015-09-16 NOTE — Assessment & Plan Note (Addendum)
09/09/15 TSH 5.368, update TSH in 3 months

## 2015-09-16 NOTE — Assessment & Plan Note (Signed)
Blood pressure is controlled, continue Lisinopril 5mg  daily, Metoprolol 12.5mg  bid. 09/09/15 Na 139, Bun 29, creat 1.24

## 2015-09-16 NOTE — Assessment & Plan Note (Signed)
Stable, continue Ducolax 10mg  pr prn, Senna S I qd

## 2015-09-16 NOTE — Assessment & Plan Note (Signed)
Mood is stabilized, continue Depakote 125mg  bid.

## 2015-09-16 NOTE — Assessment & Plan Note (Signed)
Continue Tylenol 500mg  bid and prn

## 2015-09-16 NOTE — Assessment & Plan Note (Signed)
09/09/15 Hgb 9.7 09/16/15 Fe sat, Iron, B12, Folate, Retic count, Ferritin, TIBC. Also TSH and CBC in 3 months.

## 2015-09-16 NOTE — Assessment & Plan Note (Signed)
Stable, continue DuoNeb q6h prn

## 2015-10-19 ENCOUNTER — Encounter: Payer: Self-pay | Admitting: Internal Medicine

## 2015-10-19 ENCOUNTER — Telehealth: Payer: Self-pay | Admitting: Internal Medicine

## 2015-10-19 DIAGNOSIS — R4701 Aphasia: Secondary | ICD-10-CM | POA: Insufficient documentation

## 2015-10-19 DIAGNOSIS — E639 Nutritional deficiency, unspecified: Secondary | ICD-10-CM | POA: Insufficient documentation

## 2015-10-19 DIAGNOSIS — F29 Unspecified psychosis not due to a substance or known physiological condition: Secondary | ICD-10-CM

## 2015-10-19 DIAGNOSIS — Z961 Presence of intraocular lens: Secondary | ICD-10-CM | POA: Insufficient documentation

## 2015-10-19 DIAGNOSIS — IMO0001 Reserved for inherently not codable concepts without codable children: Secondary | ICD-10-CM | POA: Insufficient documentation

## 2015-10-19 DIAGNOSIS — H3530 Unspecified macular degeneration: Secondary | ICD-10-CM | POA: Insufficient documentation

## 2015-10-19 DIAGNOSIS — H40009 Preglaucoma, unspecified, unspecified eye: Secondary | ICD-10-CM | POA: Insufficient documentation

## 2015-10-19 DIAGNOSIS — J439 Emphysema, unspecified: Secondary | ICD-10-CM | POA: Insufficient documentation

## 2015-10-19 DIAGNOSIS — S72009A Fracture of unspecified part of neck of unspecified femur, initial encounter for closed fracture: Secondary | ICD-10-CM | POA: Insufficient documentation

## 2015-10-19 MED ORDER — RISPERIDONE 0.5 MG PO TABS: ORAL_TABLET | ORAL | Status: AC

## 2015-10-19 NOTE — Telephone Encounter (Signed)
Received notification from nursing staff at [facility] the patient was noted eating a moderate amount of her feet she has known dementia. She had no indications of pain or discomfort. Staff encouraged fluids following noting this. They want to know if there is anything that can be done to decrease his behavior. Patient is incontinent of urine and stool.  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 I added Risperdal 0.5 mg twice daily.

## 2015-10-21 ENCOUNTER — Encounter: Payer: Self-pay | Admitting: Nurse Practitioner

## 2015-10-21 ENCOUNTER — Non-Acute Institutional Stay (SKILLED_NURSING_FACILITY): Payer: Medicare Other | Admitting: Nurse Practitioner

## 2015-10-21 DIAGNOSIS — F0280 Dementia in other diseases classified elsewhere without behavioral disturbance: Secondary | ICD-10-CM | POA: Diagnosis not present

## 2015-10-21 DIAGNOSIS — R946 Abnormal results of thyroid function studies: Secondary | ICD-10-CM | POA: Diagnosis not present

## 2015-10-21 DIAGNOSIS — F324 Major depressive disorder, single episode, in partial remission: Secondary | ICD-10-CM | POA: Diagnosis not present

## 2015-10-21 DIAGNOSIS — N189 Chronic kidney disease, unspecified: Secondary | ICD-10-CM | POA: Diagnosis not present

## 2015-10-21 DIAGNOSIS — K5900 Constipation, unspecified: Secondary | ICD-10-CM

## 2015-10-21 DIAGNOSIS — G309 Alzheimer's disease, unspecified: Secondary | ICD-10-CM

## 2015-10-21 DIAGNOSIS — I10 Essential (primary) hypertension: Secondary | ICD-10-CM

## 2015-10-21 DIAGNOSIS — M129 Arthropathy, unspecified: Secondary | ICD-10-CM

## 2015-10-21 DIAGNOSIS — R7989 Other specified abnormal findings of blood chemistry: Secondary | ICD-10-CM

## 2015-10-21 DIAGNOSIS — D631 Anemia in chronic kidney disease: Secondary | ICD-10-CM | POA: Diagnosis not present

## 2015-10-21 DIAGNOSIS — J410 Simple chronic bronchitis: Secondary | ICD-10-CM | POA: Diagnosis not present

## 2015-10-21 NOTE — Assessment & Plan Note (Signed)
SNF for care needs, Depakote 125mg  bid to stabilize mood and manage behaviors

## 2015-10-21 NOTE — Assessment & Plan Note (Signed)
Continue Tylenol 500mg  bid and prn

## 2015-10-21 NOTE — Assessment & Plan Note (Signed)
Stable, continue DuoNeb q6h prn

## 2015-10-21 NOTE — Assessment & Plan Note (Signed)
09/09/15 TSH 5.368, update TSH in 3 months

## 2015-10-21 NOTE — Assessment & Plan Note (Signed)
Blood pressure is controlled, continue Lisinopril 5mg  daily, Metoprolol 12.5mg  bid. 09/09/15 Na 139, Bun 29, creat 1.24

## 2015-10-21 NOTE — Assessment & Plan Note (Addendum)
Mood is stabilized, continue Depakote 125mg  bid. Risperdal 0.5mg  daily for psychotic behaviors of coprophagia.

## 2015-10-21 NOTE — Progress Notes (Signed)
Patient ID: Rhonda Bridges, female   DOB: 1920-08-25, 82 y.o.   MRN: [id number]  Location:  [facility] Room Number: 109 B Place of Service:  SNF (31) Provider: Arna Snipe Raenell Mensing NP  [name], [name], MD  Patient Care Team: Kimber Relic, MD as PCP - General (Internal Medicine) Florentina Jenny, MD as Referring Physician [facility] Medicine)  Extended Emergency Contact Information Primary Emergency Contact: Curry,Patricia Address: [address]          [place], [place] [address] Darden Amber of [place] Home Phone: [phone] Mobile Phone: [phone] Relation: Grandaughter Secondary Emergency Contact: Jodi Mourning Address: [address]          [place], [place] [address] Darden Amber of [place] Home Phone: [phone] Relation: Daughter  Code Status:  DNR Goals of care: Advanced Directive information Advanced Directives 10/21/2015  Does patient have an advance directive? No  Does patient want to make changes to advanced directive? No - Patient declined  Copy of advanced directive(s) in chart? No - copy requested     Chief Complaint  Patient presents with  . Medical Management of Chronic Issues    HPI:  Pt is a 82 y.o. female seen today for medical management of chronic diseases.  Hx of alzheimer's dementia, resides in SNF for care needs, her mood and behaviors are managed with Depakote  bid, blood pressure is controlled while on Lisinopril  qd and Metoprolol 12.5mg  bid. Prn DuoNeb for cough and SOB related to her hx of COPD. Anemia, last Hgb 9.7 09/09/15, normal Iron, folate, Vit B12   Past Medical History  Diagnosis Date  . Alzheimer's dementia   . Hypertension   . Expressive aphasia   . Depression, major (HCC)   . COPD (chronic obstructive pulmonary disease) (HCC)   . Peripheral vascular disease (HCC)   . Incontinent of urine   . Fecal incontinence   . Constipation   . Insomnia   . Glaucoma    Past Surgical History  Procedure Laterality Date    . Total hip arthroplasty Left 2010    No Known Allergies    Medication List       This list is accurate as of: 10/21/15 11:59 PM.  Always use your most recent med list.               acetaminophen 500 MG tablet  Commonly known as:  TYLENOL  Take 500 mg by mouth every 8 (eight) hours as needed (Take one tab by mouth twice daily).     aspirin 81 MG tablet  Take 81 mg by mouth daily.     bisacodyl 10 MG suppository  Commonly known as:  DULCOLAX  Place 10 mg rectally daily as needed.     Cranberry 405 MG Caps  Take 1 capsule by mouth 2 (two) times daily.     divalproex 125 MG capsule  Commonly known as:  DEPAKOTE SPRINKLES  Take 1 capsule (125 mg total) by mouth 2 (two) times daily.     feeding supplement (PRO-STAT SUGAR FREE 64) Liqd  Take 30 mLs by mouth daily.     ipratropium-albuterol 0.5-2.5 (3) MG/3ML Soln  Commonly known as:  DUONEB  Take 3 mLs by nebulization every 6 (six) hours as needed. For wheezing     latanoprost 0.005 % ophthalmic solution  Commonly known as:  XALATAN  Place 1 drop into both eyes at bedtime.     lisinopril 5 MG tablet  Commonly known as:  PRINIVIL,ZESTRIL  Take 5 mg by mouth daily.     Melatonin 1 MG Tabs  Take 1 mg by mouth at bedtime.     metoprolol succinate 25 MG 24 hr tablet  Commonly known as:  TOPROL-XL  Take 12.5 mg by mouth 2 (two) times daily.     multivitamins with iron Tabs tablet  Take 1 tablet by mouth daily.     risperiDONE 0.5 MG tablet  Commonly known as:  RISPERDAL  One twice daily to treat Coprophagia     senna-docusate 8.6-50 MG tablet  Commonly known as:  Senokot-S  Take 1 tablet by mouth every morning.        Review of Systems  Constitutional: Negative for fever, chills, diaphoresis, activity change, appetite change, fatigue and unexpected weight change.       Elderly and frail  HENT: Negative for congestion, ear discharge, ear pain, hearing loss, postnasal drip, rhinorrhea, sore throat,  tinnitus, trouble swallowing and voice change.        Edentulous  Eyes: Negative for pain, redness, itching and visual disturbance.  Respiratory: Negative for cough, choking, shortness of breath and wheezing.        History COPD. Breathing easily at this time.  Cardiovascular: Negative for chest pain, palpitations and leg swelling.       History hypertension and peripheral vascular disease.  Gastrointestinal: Negative for nausea, abdominal pain, diarrhea, constipation and abdominal distention.       Eating and swallowing well without evidence of choking. Fecal incontinence.  Endocrine: Negative for cold intolerance, heat intolerance, polydipsia, polyphagia and polyuria.  Genitourinary: Negative for dysuria, urgency, frequency, hematuria, flank pain, vaginal discharge, difficulty urinating and pelvic pain.       Urinary incontinence by history  Musculoskeletal: Negative for myalgias, back pain, arthralgias, gait problem, neck pain and neck stiffness.  Skin: Negative for color change, pallor and rash.  Allergic/Immunologic: Negative.   Neurological: Negative for dizziness, tremors, seizures, syncope, weakness, numbness and headaches.       History of Alzheimer's dementia  Hematological: Negative for adenopathy. Does not bruise/bleed easily.  Psychiatric/Behavioral: Positive for confusion and sleep disturbance. Negative for suicidal ideas, hallucinations, behavioral problems, dysphoric mood and agitation. The patient is not nervous/anxious and is not hyperactive.     Immunization History  Administered Date(s) Administered  . PPD Test 08/09/2015   Pertinent  Health Maintenance Due  Topic Date Due  . DEXA SCAN  10/01/1987  . PNA vac Low Risk Adult (1 of 2 - PCV13) 10/01/1987  . INFLUENZA VACCINE  12/29/2015   No flowsheet data found. Functional Status Survey:    Filed Vitals:   10/21/15 1422  BP: 150/80  Pulse: 60  Temp: 98.1 F (36.7 C)  TempSrc: Oral  Resp: 20  Height: 5\' 6"   (1.676 m)  Weight: 116 lb (52.617 kg)   Body mass index is 18.73 kg/(m^2). Physical Exam  Constitutional: She is oriented to person, place, and time. She appears well-developed. No distress.  HENT:  Right Ear: External ear normal.  Left Ear: External ear normal.  Nose: Nose normal.  Mouth/Throat: Oropharynx is clear and moist. No oropharyngeal exudate.  Eyes: Conjunctivae and EOM are normal. Pupils are equal, round, and reactive to light. No scleral icterus.  Neck: No JVD present. No tracheal deviation present. No thyromegaly present.  Cardiovascular: Normal rate, regular rhythm and normal heart sounds.  Exam reveals no gallop and no friction rub.   No murmur heard. Diminished DP and PT bilaterally  Pulmonary/Chest: Effort normal. No respiratory distress. She has no wheezes. She has no rales. She exhibits no tenderness.  Abdominal: She exhibits no distension and no mass. There is no tenderness.  Musculoskeletal: Normal range of motion. She exhibits no edema or tenderness.  generalized weakness  Lymphadenopathy:    She has no cervical adenopathy.  Neurological: She is alert and oriented to person, place, and time. No cranial nerve deficit. Coordination normal.  Severely demented. Limited responses verbally. Mouth smacking.   Skin: No rash noted. She is not diaphoretic. No erythema. No pallor.  Scar left hip from previous replacement  Psychiatric: Her behavior is normal.  Avoids eye contact.     Labs reviewed:  Recent Labs  09/09/15  NA 139  BUN 29*  CREATININE 1.2*    Recent Labs  09/09/15  ALT 13  ALKPHOS 70    Recent Labs  09/09/15  WBC 3.8  HGB 9.7*  HCT 33*  PLT 226    Lab Results  Component Value Date   HGBA1C 5.6 09/09/2015   No results found for: CHOL, HDL, LDLCALC, LDLDIRECT, TRIG, CHOLHDL  Significant Diagnostic Results in last 30 days:  No results found.  Assessment/Plan  Hypertension Blood pressure is controlled, continue Lisinopril 5mg   daily, Metoprolol 12.5mg  bid. 09/09/15 Na 139, Bun 29, creat 1.24   Elevated TSH 09/09/15 TSH 5.368, update TSH in 3 months   Depression, major (HCC) Mood is stabilized, continue Depakote 125mg  bid. Risperdal 0.5mg  daily for psychotic behaviors of coprophagia.     COPD (chronic obstructive pulmonary disease) (HCC) Stable, continue DuoNeb q6h prn   Constipation Stable, continue Ducolax 10mg  pr prn, Senna S I qd   Arthritis involving multiple sites Continue Tylenol 500mg  bid and prn    Anemia in chronic kidney disease 09/09/15 Hgb 9.7 09/16/15 Fe sat, Iron, B12, Folate, Retic count, Ferritin, TIBC. Also TSH and CBC in 3 months.     Alzheimer's dementia SNF for care needs, Depakote 125mg  bid to stabilize mood and manage behaviors     Family/ staff Communication: continue SNF for care needs   Labs/tests ordered: none

## 2015-10-21 NOTE — Assessment & Plan Note (Signed)
09/09/15 Hgb 9.7 09/16/15 Fe sat, Iron, B12, Folate, Retic count, Ferritin, TIBC. Also TSH and CBC in 3 months.

## 2015-10-21 NOTE — Assessment & Plan Note (Signed)
Stable, continue Ducolax 10mg  pr prn, Senna S I qd

## 2015-10-28 ENCOUNTER — Non-Acute Institutional Stay (SKILLED_NURSING_FACILITY): Payer: Medicare Other | Admitting: Nurse Practitioner

## 2015-10-28 DIAGNOSIS — F320 Major depressive disorder, single episode, mild: Secondary | ICD-10-CM

## 2015-10-28 DIAGNOSIS — F0280 Dementia in other diseases classified elsewhere without behavioral disturbance: Secondary | ICD-10-CM | POA: Diagnosis not present

## 2015-10-28 DIAGNOSIS — G309 Alzheimer's disease, unspecified: Secondary | ICD-10-CM

## 2015-10-28 DIAGNOSIS — K5900 Constipation, unspecified: Secondary | ICD-10-CM | POA: Diagnosis not present

## 2015-10-28 DIAGNOSIS — J410 Simple chronic bronchitis: Secondary | ICD-10-CM | POA: Diagnosis not present

## 2015-10-28 DIAGNOSIS — I10 Essential (primary) hypertension: Secondary | ICD-10-CM | POA: Diagnosis not present

## 2015-10-28 DIAGNOSIS — F29 Unspecified psychosis not due to a substance or known physiological condition: Secondary | ICD-10-CM

## 2015-10-28 NOTE — Assessment & Plan Note (Signed)
Stable, continue Ducolax 10mg  pr prn, Senna S I qd

## 2015-10-28 NOTE — Assessment & Plan Note (Signed)
DuoNeb for cough and SOB related to her hx of COPD, exacerbation, 10/24/15 7 day course of Mucinex, Avelox started.

## 2015-10-28 NOTE — Assessment & Plan Note (Signed)
Mood is stabilized, continue Depakote 125mg  bid.

## 2015-10-28 NOTE — Assessment & Plan Note (Signed)
SNF for care needs, Depakote 125mg  bid to stabilize mood and manage behaviors

## 2015-10-28 NOTE — Assessment & Plan Note (Signed)
Mood is stabilized, continue Depakote 125mg  bid. Risperdal 0.5mg  daily for psychotic behaviors of coprophagia.

## 2015-10-28 NOTE — Assessment & Plan Note (Signed)
Blood pressure is controlled, continue Lisinopril 5mg  daily, Metoprolol 12.5mg  bid. 09/09/15 Na 139, Bun 29, creat 1.24

## 2015-10-28 NOTE — Progress Notes (Signed)
Patient ID: Rhonda Bridges, female   DOB: May 21, 1921, 82 y.o.   MRN: [id number]  Location:   [facility] Service:  SNF (31) Provider: Chipper Oman NP  GREEN, [name], MD  Patient Care Team: Kimber Relic, MD as PCP - General (Internal Medicine) Florentina Jenny, MD as Referring Physician [facility] Medicine)  Extended Emergency Contact Information Primary Emergency Contact: Curry,Patricia Address: [address]          [place], [place] [address] Darden [place] of [place] Home Phone: [phone] Mobile Phone: [phone] Relation: Grandaughter Secondary Emergency Contact: Jodi Mourning Address: [address]          [place], [place] [address] D[place] of [place] Home Phone: [phone] Relation: Daughter  Code Status:  DNR Goals of care: Advanced Directive information Advanced Directives 10/21/2015  Does patient have an advance directive? No  Does patient want to make changes to advanced directive? No - Patient declined  Copy of advanced directive(s) in chart? No - copy requested     No chief complaint on file.   HPI:  Pt is a 82 y.o. female seen today for medical management of chronic diseases.  Hx of alzheimer's dementia, resides in SNF for care needs, her mood and behaviors are managed with Depakote  bid, blood pressure is controlled while on Lisinopril  qd and Metoprolol 12.5mg  bid. Prn DuoNeb for cough and SOB related to her hx of COPD, exacerbation, 10/24/15 7 day course of Mucinex, Avelox started. Anemia, last Hgb 9.7 09/09/15, normal Iron, folate, Vit B12   Past Medical History  Diagnosis Date  . Alzheimer's dementia   . Hypertension   . Expressive aphasia   . Depression, major (HCC)   . COPD (chronic obstructive pulmonary disease) (HCC)   . Peripheral vascular disease (HCC)   . Incontinent of urine   . Fecal incontinence   . Constipation   . Insomnia   . Glaucoma    Past Surgical History  Procedure Laterality Date  . Total hip arthroplasty Left 2010      No Known Allergies    Medication List       This list is accurate as of: 10/28/15 11:59 PM.  Always use your most recent med list.               acetaminophen 500 MG tablet  Commonly known as:  TYLENOL  Take 500 mg by mouth every 8 (eight) hours as needed (Take one tab by mouth twice daily).     aspirin 81 MG tablet  Take 81 mg by mouth daily.     bisacodyl 10 MG suppository  Commonly known as:  DULCOLAX  Place 10 mg rectally daily as needed.     Cranberry 405 MG Caps  Take 1 capsule by mouth 2 (two) times daily.     divalproex 125 MG capsule  Commonly known as:  DEPAKOTE SPRINKLES  Take 1 capsule (125 mg total) by mouth 2 (two) times daily.     feeding supplement (PRO-STAT SUGAR FREE 64) Liqd  Take 30 mLs by mouth daily.     ipratropium-albuterol 0.5-2.5 (3) MG/3ML Soln  Commonly known as:  DUONEB  Take 3 mLs by nebulization every 6 (six) hours as needed. For wheezing     latanoprost 0.005 % ophthalmic solution  Commonly known as:  XALATAN  Place 1 drop into both eyes at bedtime.     lisinopril 5 MG tablet  Commonly known as:  PRINIVIL,ZESTRIL  Take 5 mg by  mouth daily.     Melatonin 1 MG Tabs  Take 1 mg by mouth at bedtime.     metoprolol succinate 25 MG 24 hr tablet  Commonly known as:  TOPROL-XL  Take 12.5 mg by mouth 2 (two) times daily.     multivitamins with iron Tabs tablet  Take 1 tablet by mouth daily.     risperiDONE 0.5 MG tablet  Commonly known as:  RISPERDAL  One twice daily to treat Coprophagia     senna-docusate 8.6-50 MG tablet  Commonly known as:  Senokot-S  Take 1 tablet by mouth every morning.        Review of Systems  Constitutional: Negative for fever, chills, diaphoresis, activity change, appetite change, fatigue and unexpected weight change.       Elderly and frail  HENT: Negative for congestion, ear discharge, ear pain, hearing loss, postnasal drip, rhinorrhea, sore throat, tinnitus, trouble swallowing and voice  change.        Edentulous  Eyes: Negative for pain, redness, itching and visual disturbance.  Respiratory: Negative for cough, choking, shortness of breath and wheezing.        History COPD. Breathing easily at this time.  Cardiovascular: Negative for chest pain, palpitations and leg swelling.       History hypertension and peripheral vascular disease.  Gastrointestinal: Negative for nausea, abdominal pain, diarrhea, constipation and abdominal distention.       Eating and swallowing well without evidence of choking. Fecal incontinence.  Endocrine: Negative for cold intolerance, heat intolerance, polydipsia, polyphagia and polyuria.  Genitourinary: Negative for dysuria, urgency, frequency, hematuria, flank pain, vaginal discharge, difficulty urinating and pelvic pain.       Urinary incontinence by history  Musculoskeletal: Negative for myalgias, back pain, arthralgias, gait problem, neck pain and neck stiffness.  Skin: Negative for color change, pallor and rash.  Allergic/Immunologic: Negative.   Neurological: Negative for dizziness, tremors, seizures, syncope, weakness, numbness and headaches.       History of Alzheimer's dementia  Hematological: Negative for adenopathy. Does not bruise/bleed easily.  Psychiatric/Behavioral: Positive for confusion and sleep disturbance. Negative for suicidal ideas, hallucinations, behavioral problems, dysphoric mood and agitation. The patient is not nervous/anxious and is not hyperactive.     Immunization History  Administered Date(s) Administered  . PPD Test 08/09/2015   Pertinent  Health Maintenance Due  Topic Date Due  . DEXA SCAN  10/01/1987  . PNA vac Low Risk Adult (1 of 2 - PCV13) 10/01/1987  . INFLUENZA VACCINE  12/29/2015   No flowsheet data found. Functional Status Survey:    There were no vitals filed for this visit. There is no weight on file to calculate BMI. Physical Exam  Constitutional: She is oriented to person, place, and  time. She appears well-developed. No distress.  HENT:  Right Ear: External ear normal.  Left Ear: External ear normal.  Nose: Nose normal.  Mouth/Throat: Oropharynx is clear and moist. No oropharyngeal exudate.  Eyes: Conjunctivae and EOM are normal. Pupils are equal, round, and reactive to light. No scleral icterus.  Neck: No JVD present. No tracheal deviation present. No thyromegaly present.  Cardiovascular: Normal rate, regular rhythm and normal heart sounds.  Exam reveals no gallop and no friction rub.   No murmur heard. Diminished DP and PT bilaterally  Pulmonary/Chest: Effort normal. No respiratory distress. She has no wheezes. She has no rales. She exhibits no tenderness.  Abdominal: She exhibits no distension and no mass. There is no tenderness.  Musculoskeletal:  Normal range of motion. She exhibits no edema or tenderness.  generalized weakness  Lymphadenopathy:    She has no cervical adenopathy.  Neurological: She is alert and oriented to person, place, and time. No cranial nerve deficit. Coordination normal.  Severely demented. Limited responses verbally. Mouth smacking.   Skin: No rash noted. She is not diaphoretic. No erythema. No pallor.  Scar left hip from previous replacement  Psychiatric: Her behavior is normal.  Avoids eye contact.     Labs reviewed:  Recent Labs  09/09/15  NA 139  BUN 29*  CREATININE 1.2*    Recent Labs  09/09/15  ALT 13  ALKPHOS 70    Recent Labs  09/09/15  WBC 3.8  HGB 9.7*  HCT 33*  PLT 226    Lab Results  Component Value Date   HGBA1C 5.6 09/09/2015   No results found for: CHOL, HDL, LDLCALC, LDLDIRECT, TRIG, CHOLHDL  Significant Diagnostic Results in last 30 days:  No results found.  Assessment/Plan  COPD (chronic obstructive pulmonary disease) (HCC) DuoNeb for cough and SOB related to her hx of COPD, exacerbation, 10/24/15 7 day course of Mucinex, Avelox started.  Hypertension Blood pressure is controlled, continue  Lisinopril 5mg  daily, Metoprolol 12.5mg  bid. 09/09/15 Na 139, Bun 29, creat 1.24   Constipation Stable, continue Ducolax 10mg  pr prn, Senna S I qd    Alzheimer's dementia SNF for care needs, Depakote 125mg  bid to stabilize mood and manage behaviors    Depression, major (HCC) Mood is stabilized, continue Depakote 125mg  bid. Risperdal 0.5mg  daily for psychotic behaviors of coprophagia.   Psychosis Mood is stabilized, continue Depakote 125mg  bid.      Family/ staff Communication: continue SNF for care needs   Labs/tests ordered: none

## 2016-04-29 DEATH — deceased

## 2016-08-24 ENCOUNTER — Telehealth: Payer: Self-pay | Admitting: Internal Medicine

## 2016-08-24 NOTE — Telephone Encounter (Signed)
Opened in error. VDM (DD)

## 2017-04-07 IMAGING — CR DG CHEST 2V
2 series · 2 of 2 positions shown · non-contrast
Comparison: 01/25/2013, 01/03/2011

CLINICAL DATA: [AGE] female with a history of cough.

EXAM:
CHEST - 2 VIEW

[w chest lat]
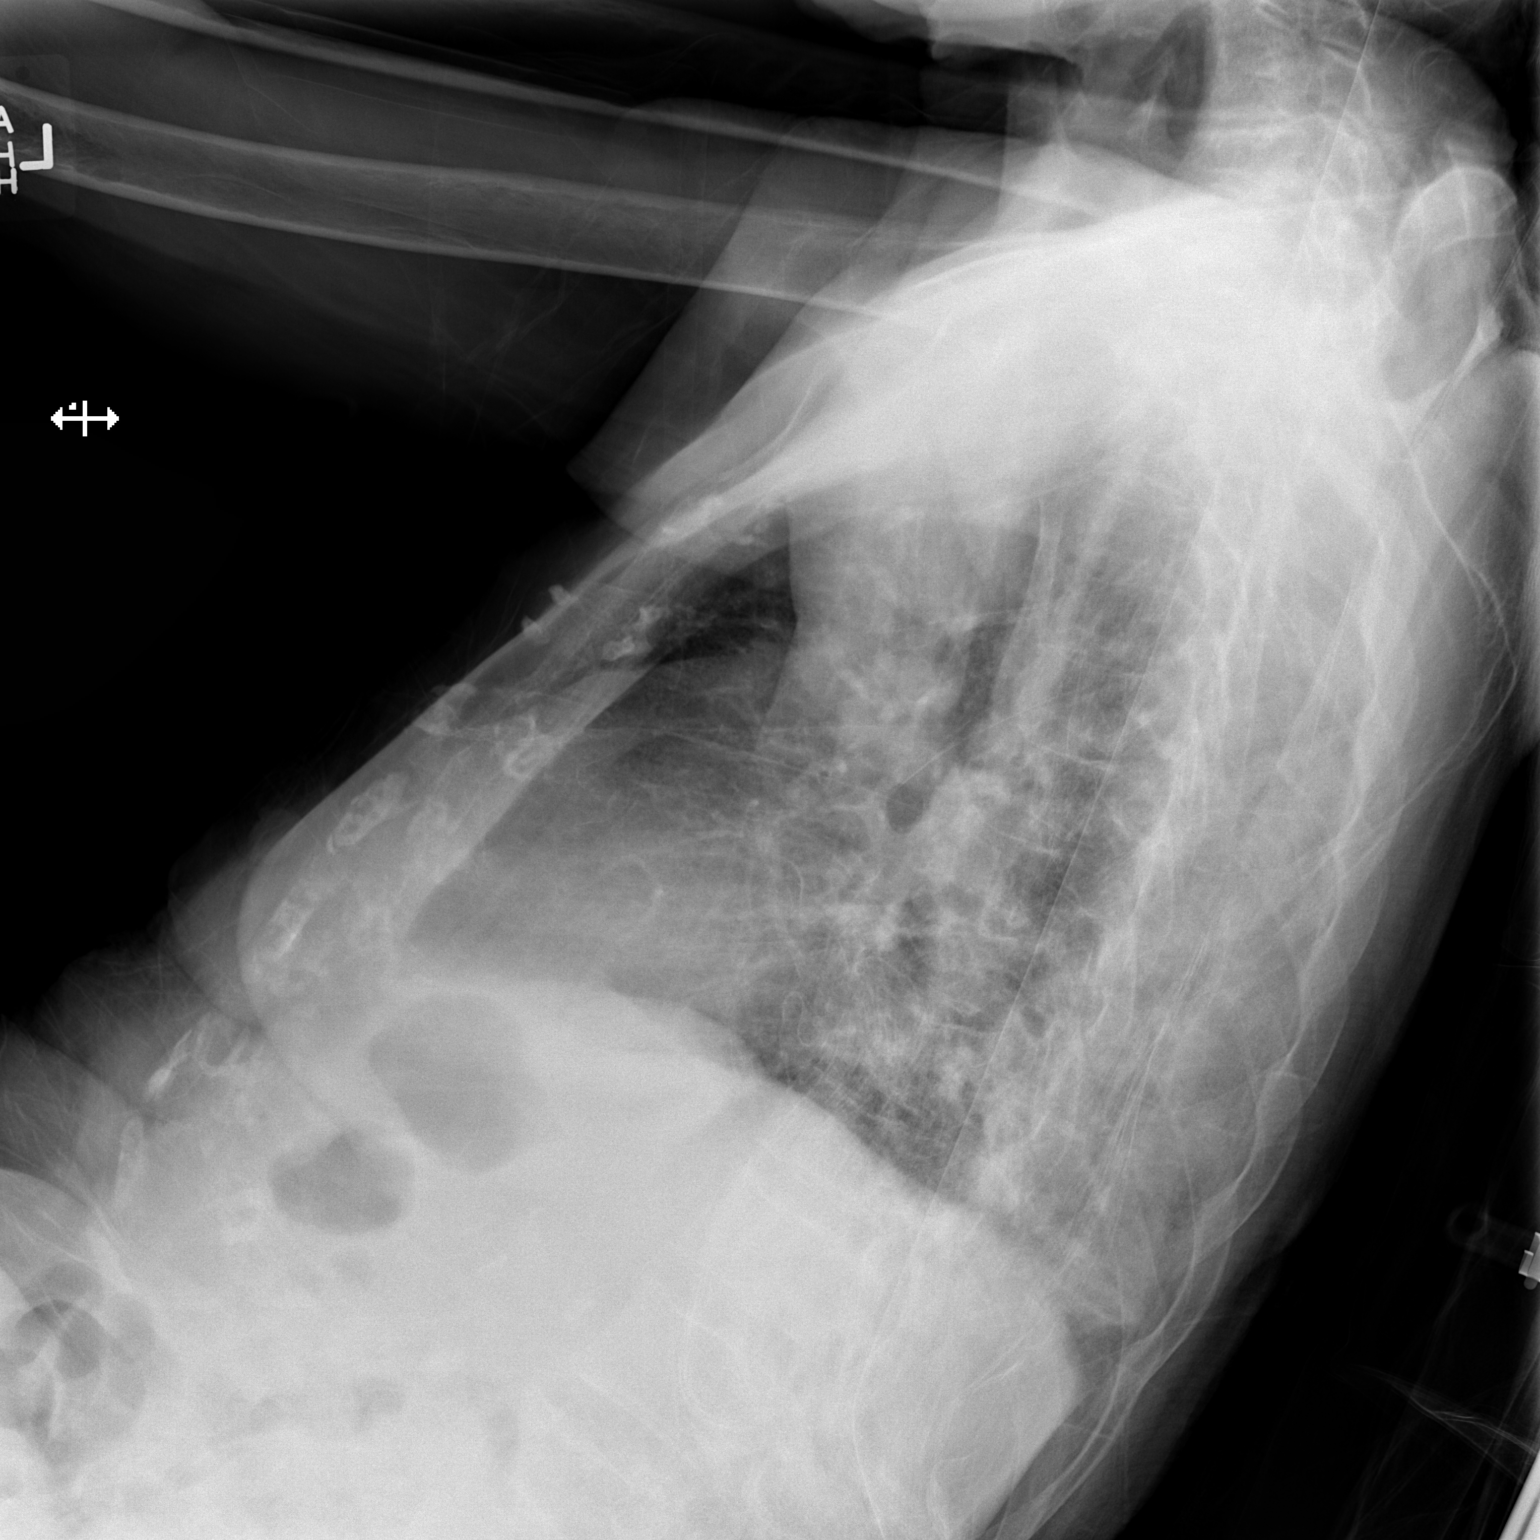

[x chest ap]
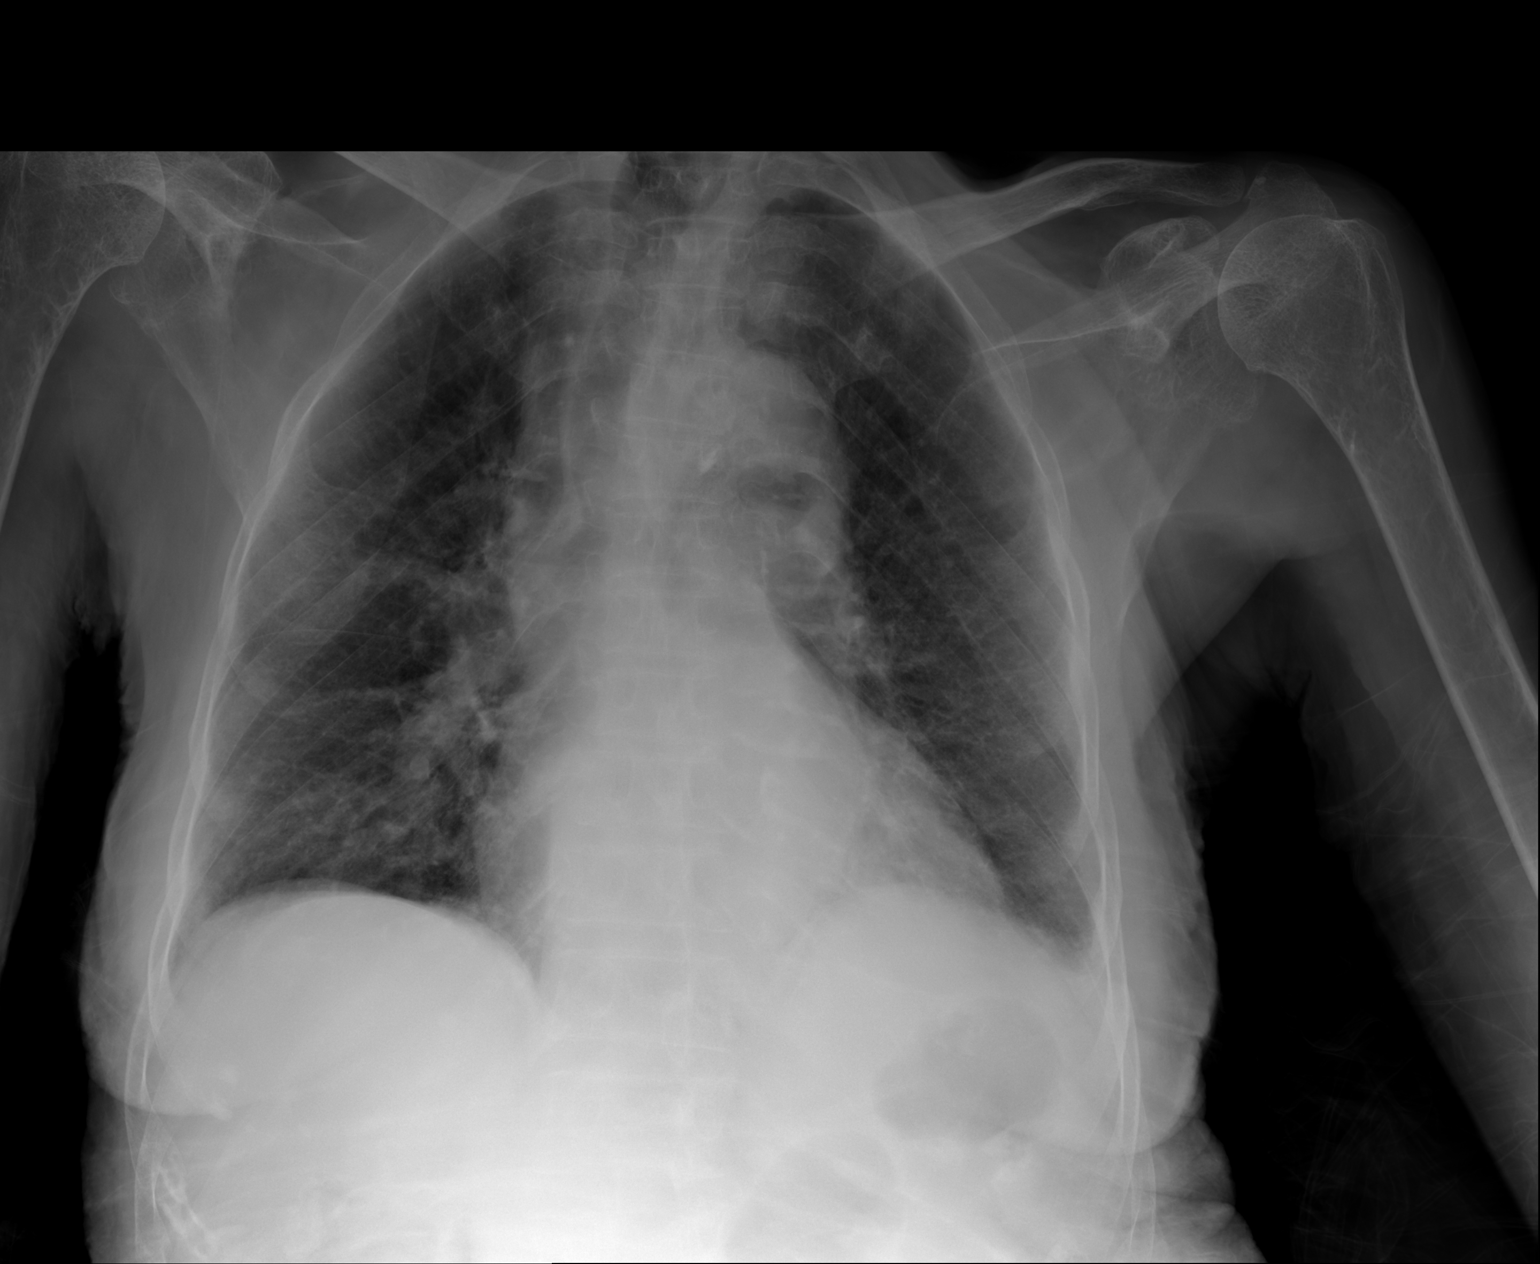

[2 of 2 positions shown; findings below may reference images not displayed]

FINDINGS: Cardiomediastinal silhouette unchanged with cardiomegaly. Tortuosity
of descending thoracic aorta.

Bilateral interstitial opacities with patchy opacities at the lung
bases.

No pneumothorax. Blunting of the left costophrenic angle,
potentially pleural fluid.

Degenerative changes of the spine.

Osteopenia.

Degenerative changes of the shoulders.
IMPRESSION: Ill-defined interstitial and airspace opacities, potentially early
edema or multifocal infection given the history. Likely small
pleural effusion on the left.

Atherosclerosis.
